# Patient Record
Sex: Female | Born: 1995 | Race: White | Hispanic: No | Marital: Single | Smoking: Current every day smoker
Health system: Southern US, Community
[De-identification: ages and names within clinical notes are randomized; demographics above are authoritative.]

## PROBLEM LIST (undated history)

## (undated) ENCOUNTER — Inpatient Hospital Stay: Payer: Self-pay

## (undated) DIAGNOSIS — D649 Anemia, unspecified: Secondary | ICD-10-CM

## (undated) DIAGNOSIS — K353 Acute appendicitis with localized peritonitis, without perforation or gangrene: Secondary | ICD-10-CM

## (undated) DIAGNOSIS — Z315 Encounter for genetic counseling: Secondary | ICD-10-CM

---
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
## 2017-05-20 ENCOUNTER — Emergency Department: Payer: Medicaid Other

## 2017-05-20 ENCOUNTER — Other Ambulatory Visit: Payer: Self-pay

## 2017-05-20 ENCOUNTER — Observation Stay
Admission: EM | Admit: 2017-05-20 | Discharge: 2017-05-22 | Disposition: A | Discharge status: Home / Self Care | Payer: Medicaid Other | Attending: Surgery | Admitting: Surgery

## 2017-05-20 DIAGNOSIS — F17210 Nicotine dependence, cigarettes, uncomplicated: Secondary | ICD-10-CM | POA: Diagnosis not present

## 2017-05-20 DIAGNOSIS — K3589 Other acute appendicitis without perforation or gangrene: Secondary | ICD-10-CM

## 2017-05-20 DIAGNOSIS — E876 Hypokalemia: Secondary | ICD-10-CM | POA: Diagnosis not present

## 2017-05-20 DIAGNOSIS — K353 Acute appendicitis with localized peritonitis, without perforation or gangrene: Secondary | ICD-10-CM | POA: Diagnosis present

## 2017-05-20 DIAGNOSIS — K3533 Acute appendicitis with perforation and localized peritonitis, with abscess: Principal | ICD-10-CM | POA: Insufficient documentation

## 2017-05-20 LAB — BASIC METABOLIC PANEL
Anion gap: 12 (ref 5–15)
BUN: 20 mg/dL (ref 6–20)
CHLORIDE: 102 mmol/L (ref 101–111)
CO2: 24 mmol/L (ref 22–32)
CREATININE: 0.84 mg/dL (ref 0.44–1.00)
Calcium: 9.6 mg/dL (ref 8.9–10.3)
GFR calc Af Amer: >60 mL/min (ref >60)
GFR calc non Af Amer: >60 mL/min (ref >60)
Glucose, Bld: 120 mg/dL — ABNORMAL HIGH (ref 65–99)
Potassium: 2.9 mmol/L — ABNORMAL LOW (ref 3.5–5.1)
Sodium: 138 mmol/L (ref 135–145)

## 2017-05-20 LAB — CBC
HEMATOCRIT: 42.2 % (ref 35.0–47.0)
Hemoglobin: 14.1 g/dL (ref 12.0–16.0)
MCH: 30.8 pg (ref 26.0–34.0)
MCHC: 33.5 g/dL (ref 32.0–36.0)
MCV: 91.9 fL (ref 80.0–100.0)
PLATELETS: 187 10*3/uL (ref 150–440)
RBC: 4.59 MIL/uL (ref 3.80–5.20)
RDW: 13.1 % (ref 11.5–14.5)
WBC: 13.7 10*3/uL — ABNORMAL HIGH (ref 3.6–11.0)

## 2017-05-20 LAB — TROPONIN I: Troponin I: <0.03 ng/mL (ref <0.03)

## 2017-05-20 LAB — POCT PREGNANCY, URINE: Preg Test, Ur: NEGATIVE

## 2017-05-20 NOTE — ED Provider Notes (Signed)
[facility] Emergency Department Provider Note   First MD Initiated Contact with Patient 05/20/17 2344     (approximate)  I have reviewed the triage vital signs and the nursing notes.   HISTORY  Chief Complaint Rib Injury    HPI Stephanie Navarro is a 21 y.o. female presents to the emergency department 1 day history of right-sided abdominal pain is currently 7 out of 10 accompanied by nausea and patient denies any fever.  Patient denies any urinary symptoms.  Patient states that the pain has been persistent since onset.  Patient denies any aggravating or alleviating factors.  Food intake approximately 3 PM   Past medical history None There are no active problems to display for this patient.  Past surgical history None  Prior to Admission medications   Not on File    Allergies No Known Drug Allergies No family history on file.  Social History Social History   Tobacco Use  . Smoking status: Current Every Day Smoker  . Smokeless tobacco: Never Used  Substance Use Topics  . Alcohol use: No    Frequency: Never  . Drug use: No    Review of Systems Constitutional: No fever/chills Eyes: No visual changes. ENT: No sore throat. Cardiovascular: Denies chest pain. Respiratory: Denies shortness of breath. Gastrointestinal:Positive for abdominal pain, nausea &vomiting.  No diarrhea.  No constipation. Genitourinary: Negative for dysuria. Musculoskeletal: Negative for neck pain.  Negative for back pain. Integumentary: Negative for rash. Neurological: Negative for headaches, focal weakness or numbness.  ____________________________________________   PHYSICAL EXAM:  VITAL SIGNS: ED Triage Vitals  Enc Vitals Group     BP 05/20/17 2105 (!) 113/54     Pulse Rate 05/20/17 2105 65     Resp 05/20/17 2105 18     Temp 05/20/17 2105 98.2 F (36.8 C)     Temp Source 05/20/17 2105 Oral     SpO2 05/20/17 2105 98 %     Weight 05/20/17 2102 61.2 kg  (135 lb)     Height 05/20/17 2102 1.6 m (5\' 3" )     Head Circumference --      Peak Flow --      Pain Score 05/20/17 2102 10     Pain Loc --      Pain Edu? --      Excl. in GC? --     Constitutional: Alert and oriented. Well appearing and in no acute distress. Eyes: Conjunctivae are normal.  Head: Atraumatic. Mouth/Throat: Mucous membranes are moist. Oropharynx non-erythematous. Neck: No stridor.  Cardiovascular: Normal rate, regular rhythm. Good peripheral circulation. Grossly normal heart sounds. Respiratory: Normal respiratory effort.  No retractions. Lungs CTAB. Gastrointestinal: Right lower quadrant tenderness to palpation.  No distention.  Musculoskeletal: No lower extremity tenderness nor edema. No gross deformities of extremities. Neurologic:  Normal speech and language. No gross focal neurologic deficits are appreciated.  Skin:  Skin is warm, dry and intact. No rash noted. Psychiatric: Mood and affect are normal. Speech and behavior are normal.  ____________________________________________   LABS (all labs ordered are listed, but only abnormal results are displayed)  Labs Reviewed  BASIC METABOLIC PANEL - Abnormal; Notable for the following components:      Result Value   Potassium 2.9 (*)    Glucose, Bld 120 (*)    All other components within normal limits  CBC - Abnormal; Notable for the following components:   WBC 13.7 (*)    All other components within normal limits  TROPONIN I  POC URINE PREG, ED  POCT PREGNANCY, URINE    RADIOLOGY I, Woodward N BROWN, personally viewed and evaluated these images (plain radiographs) as part of my medical decision making, as well as reviewing the written report by the radiologist.  Dg Chest 2 View  Result Date: 05/20/2017 CLINICAL DATA:  Acute onset of right-sided rib pain.  Nausea. EXAM: CHEST  2 VIEW COMPARISON:  None. FINDINGS: The lungs are well-aerated and clear. There is no evidence of focal opacification, pleural  effusion or pneumothorax. The heart is normal in size; the mediastinal contour is within normal limits. No acute osseous abnormalities are seen. IMPRESSION: No acute cardiopulmonary process seen. No displaced rib fractures identified. Electronically Signed   By: Roanna RaiderJeffery  Chang M.D.   On: 05/20/2017 21:36     Procedures   ____________________________________________   INITIAL IMPRESSION / ASSESSMENT AND PLAN / ED COURSE  As part of my medical decision making, I reviewed the following data within the electronic MEDICAL RECORD [age] year old female present with above-stated history and physical exam concerning for possible acute appendicitis and as such CT scan of the abdomen was performed.  CT findings consistent with acute appendicitis and as such patient discussed with Dr. Earlene Plateravis general surgeon on-call ____________________________________________  FINAL CLINICAL IMPRESSION(S) / ED DIAGNOSES  Final diagnoses:  Other acute appendicitis     MEDICATIONS GIVEN DURING THIS VISIT:  Medications - No data to display   ED Discharge Orders    None       Note:  This document was prepared using Dragon voice recognition software and may include unintentional dictation errors.    Darci CurrentBrown, [place] N, MD 05/21/17 [phone]

## 2017-05-20 NOTE — ED Triage Notes (Signed)
Pt presents to ED via POV from home with c/o RIGHT-sided rib pain x2 days. Pt denies any recent injury or trauma. Pt reports 10/10 pain. Pt reports (+) nausea, but denies vomiting or diarrhea. Pt is A&O, in NAD; RR even, regular, and unlabored; skin color/temp is WNL.

## 2017-05-20 NOTE — ED Notes (Addendum)
Pt reports she has right anterior rib pain.  Sx began yesterday.  Pt states no known injury.  No back pain.  No diff breathing   cig smoker.   Pt also has nausea.  Pt alert.

## 2017-05-21 ENCOUNTER — Other Ambulatory Visit: Payer: Self-pay

## 2017-05-21 ENCOUNTER — Encounter: Payer: Self-pay | Admitting: Radiology

## 2017-05-21 ENCOUNTER — Emergency Department: Payer: Medicaid Other

## 2017-05-21 ENCOUNTER — Encounter
Admission: EM | Disposition: A | Discharge status: Home / Self Care | Payer: Self-pay | Source: Home / Self Care | Attending: Emergency Medicine

## 2017-05-21 ENCOUNTER — Observation Stay: Payer: Medicaid Other | Admitting: Certified Registered Nurse Anesthetist

## 2017-05-21 DIAGNOSIS — K353 Acute appendicitis with localized peritonitis, without perforation or gangrene: Secondary | ICD-10-CM | POA: Diagnosis not present

## 2017-05-21 HISTORY — PX: LAPAROSCOPIC APPENDECTOMY: SHX408

## 2017-05-21 HISTORY — PX: APPENDECTOMY: SHX54

## 2017-05-21 HISTORY — DX: Acute appendicitis with localized peritonitis, without perforation or gangrene: K35.30

## 2017-05-21 LAB — CBC WITH DIFFERENTIAL/PLATELET
BASOS PCT: 0 %
Basophils Absolute: 0 10*3/uL (ref 0–0.1)
EOS ABS: 0 10*3/uL (ref 0–0.7)
EOS PCT: 0 %
HCT: 37.1 % (ref 35.0–47.0)
HEMOGLOBIN: 12.7 g/dL (ref 12.0–16.0)
Lymphocytes Relative: 14 %
Lymphs Abs: 1.5 10*3/uL (ref 1.0–3.6)
MCH: 31.7 pg (ref 26.0–34.0)
MCHC: 34.1 g/dL (ref 32.0–36.0)
MCV: 92.9 fL (ref 80.0–100.0)
MONO ABS: 0.8 10*3/uL (ref 0.2–0.9)
MONOS PCT: 8 %
NEUTROS PCT: 78 %
Neutro Abs: 7.9 10*3/uL — ABNORMAL HIGH (ref 1.4–6.5)
PLATELETS: 139 10*3/uL — AB (ref 150–440)
RBC: 3.99 MIL/uL (ref 3.80–5.20)
RDW: 13.2 % (ref 11.5–14.5)
WBC: 10.2 10*3/uL (ref 3.6–11.0)

## 2017-05-21 LAB — BASIC METABOLIC PANEL
Anion gap: 6 (ref 5–15)
BUN: 14 mg/dL (ref 6–20)
CALCIUM: 8.8 mg/dL — AB (ref 8.9–10.3)
CO2: 22 mmol/L (ref 22–32)
CREATININE: 0.73 mg/dL (ref 0.44–1.00)
Chloride: 110 mmol/L (ref 101–111)
Glucose, Bld: 112 mg/dL — ABNORMAL HIGH (ref 65–99)
Potassium: 3.6 mmol/L (ref 3.5–5.1)
SODIUM: 138 mmol/L (ref 135–145)

## 2017-05-21 SURGERY — APPENDECTOMY, LAPAROSCOPIC
Anesthesia: General

## 2017-05-21 SURGERY — APPENDECTOMY, LAPAROSCOPIC
Anesthesia: General | Wound class: Clean Contaminated

## 2017-05-21 MED ORDER — FENTANYL CITRATE (PF) 100 MCG/2ML IJ SOLN
INTRAMUSCULAR | Status: DC | PRN
  Administered 2017-05-21: 50 ug via INTRAVENOUS
  Administered 2017-05-21: 100 ug via INTRAVENOUS
  Administered 2017-05-21: 50 ug via INTRAVENOUS

## 2017-05-21 MED ORDER — MORPHINE SULFATE (PF) 2 MG/ML IV SOLN
2.0000 mg | INTRAVENOUS | Status: DC | PRN
  Filled 2017-05-21 (×2): qty 1

## 2017-05-21 MED ORDER — LIDOCAINE HCL (CARDIAC) 20 MG/ML IV SOLN
INTRAVENOUS | Status: DC | PRN
  Administered 2017-05-21: 40 mg via INTRAVENOUS

## 2017-05-21 MED ORDER — ETONOGESTREL 68 MG ~~LOC~~ IMPL
1.0000 | DRUG_IMPLANT | Freq: Once | SUBCUTANEOUS | Status: DC
  Filled 2017-05-21: qty 1

## 2017-05-21 MED ORDER — DEXTROSE IN LACTATED RINGERS 5 % IV SOLN
INTRAVENOUS | Status: DC
  Administered 2017-05-22: 04:00:00 via INTRAVENOUS

## 2017-05-21 MED ORDER — SUGAMMADEX SODIUM 200 MG/2ML IV SOLN
INTRAVENOUS | Status: AC
  Filled 2017-05-21: qty 4

## 2017-05-21 MED ORDER — FENTANYL CITRATE (PF) 100 MCG/2ML IJ SOLN: 25.0000 ug | INTRAMUSCULAR | Status: DC | PRN

## 2017-05-21 MED ORDER — MEPERIDINE HCL 50 MG/ML IJ SOLN: 6.2500 mg | INTRAMUSCULAR | Status: DC | PRN

## 2017-05-21 MED ORDER — OXYCODONE HCL 5 MG PO TABS
5.0000 mg | ORAL_TABLET | Freq: Once | ORAL | Status: DC | PRN
Start: 2017-05-21 — End: 2017-05-21

## 2017-05-21 MED ORDER — ONDANSETRON HCL 4 MG/2ML IJ SOLN
4.0000 mg | Freq: Four times a day (QID) | INTRAMUSCULAR | Status: DC | PRN
  Administered 2017-05-21 – 2017-05-22 (×2): 4 mg via INTRAVENOUS
  Filled 2017-05-21 (×2): qty 2

## 2017-05-21 MED ORDER — KETOROLAC TROMETHAMINE 30 MG/ML IJ SOLN
INTRAMUSCULAR | Status: DC | PRN
  Administered 2017-05-21: 30 mg via INTRAVENOUS

## 2017-05-21 MED ORDER — MIDAZOLAM HCL 2 MG/2ML IJ SOLN
INTRAMUSCULAR | Status: DC | PRN
  Administered 2017-05-21: 2 mg via INTRAVENOUS

## 2017-05-21 MED ORDER — BUPIVACAINE-EPINEPHRINE (PF) 0.25% -1:200000 IJ SOLN
INTRAMUSCULAR | Status: AC
  Filled 2017-05-21: qty 30

## 2017-05-21 MED ORDER — ACETAMINOPHEN 10 MG/ML IV SOLN
INTRAVENOUS | Status: DC | PRN
  Administered 2017-05-21: 1000 mg via INTRAVENOUS

## 2017-05-21 MED ORDER — BUPIVACAINE-EPINEPHRINE (PF) 0.25% -1:200000 IJ SOLN
INTRAMUSCULAR | Status: DC | PRN
  Administered 2017-05-21: 30 mL via PERINEURAL

## 2017-05-21 MED ORDER — ONDANSETRON 4 MG PO TBDP: 4.0000 mg | ORAL_TABLET | Freq: Four times a day (QID) | ORAL | Status: DC | PRN

## 2017-05-21 MED ORDER — LACTATED RINGERS IV SOLN
INTRAVENOUS | Status: DC
  Administered 2017-05-21: 14:00:00 via INTRAVENOUS

## 2017-05-21 MED ORDER — ACETAMINOPHEN 10 MG/ML IV SOLN
INTRAVENOUS | Status: AC
  Filled 2017-05-21: qty 100

## 2017-05-21 MED ORDER — METRONIDAZOLE IN NACL 5-0.79 MG/ML-% IV SOLN
500.0000 mg | Freq: Three times a day (TID) | INTRAVENOUS | Status: DC
  Administered 2017-05-21 (×2): 500 mg via INTRAVENOUS
  Filled 2017-05-21 (×5): qty 100

## 2017-05-21 MED ORDER — FAMOTIDINE 20 MG PO TABS
ORAL_TABLET | ORAL | Status: AC
  Filled 2017-05-21: qty 1

## 2017-05-21 MED ORDER — POTASSIUM CHLORIDE 10 MEQ/100ML IV SOLN
10.0000 meq | INTRAVENOUS | Status: DC
  Administered 2017-05-21 (×2): 10 meq via INTRAVENOUS
  Filled 2017-05-21 (×3): qty 100

## 2017-05-21 MED ORDER — ONDANSETRON HCL 4 MG/2ML IJ SOLN
INTRAMUSCULAR | Status: AC
  Filled 2017-05-21: qty 2

## 2017-05-21 MED ORDER — KETOROLAC TROMETHAMINE 30 MG/ML IJ SOLN
INTRAMUSCULAR | Status: AC
  Filled 2017-05-21: qty 1

## 2017-05-21 MED ORDER — ROCURONIUM BROMIDE 50 MG/5ML IV SOLN
INTRAVENOUS | Status: AC
  Filled 2017-05-21: qty 1

## 2017-05-21 MED ORDER — FAMOTIDINE 20 MG PO TABS
20.0000 mg | ORAL_TABLET | Freq: Once | ORAL | Status: AC
  Administered 2017-05-21: 20 mg via ORAL

## 2017-05-21 MED ORDER — KCL-LACTATED RINGERS-D5W 20 MEQ/L IV SOLN
INTRAVENOUS | Status: DC
  Administered 2017-05-21 (×2): via INTRAVENOUS
  Filled 2017-05-21 (×8): qty 1000

## 2017-05-21 MED ORDER — PROPOFOL 10 MG/ML IV BOLUS
INTRAVENOUS | Status: DC | PRN
  Administered 2017-05-21: 100 mg via INTRAVENOUS

## 2017-05-21 MED ORDER — MORPHINE SULFATE (PF) 2 MG/ML IV SOLN
2.0000 mg | INTRAVENOUS | Status: DC | PRN
  Administered 2017-05-21 (×2): 2 mg via INTRAVENOUS
  Filled 2017-05-21: qty 1

## 2017-05-21 MED ORDER — CHLORHEXIDINE GLUCONATE CLOTH 2 % EX PADS: 6.0000 | MEDICATED_PAD | Freq: Once | CUTANEOUS | Status: DC

## 2017-05-21 MED ORDER — IOPAMIDOL (ISOVUE-300) INJECTION 61%
100.0000 mL | Freq: Once | INTRAVENOUS | Status: AC | PRN
  Administered 2017-05-21: 100 mL via INTRAVENOUS

## 2017-05-21 MED ORDER — ROCURONIUM BROMIDE 100 MG/10ML IV SOLN
INTRAVENOUS | Status: DC | PRN
  Administered 2017-05-21: 40 mg via INTRAVENOUS

## 2017-05-21 MED ORDER — SUGAMMADEX SODIUM 200 MG/2ML IV SOLN
INTRAVENOUS | Status: DC | PRN
  Administered 2017-05-21: 250 mg via INTRAVENOUS

## 2017-05-21 MED ORDER — PROPOFOL 10 MG/ML IV BOLUS
INTRAVENOUS | Status: AC
  Filled 2017-05-21: qty 20

## 2017-05-21 MED ORDER — DEXTROSE 5 % IV SOLN
2.0000 g | INTRAVENOUS | Status: DC
  Administered 2017-05-21: 2 g via INTRAVENOUS
  Filled 2017-05-21 (×2): qty 2

## 2017-05-21 MED ORDER — PROMETHAZINE HCL 25 MG/ML IJ SOLN: 6.2500 mg | INTRAMUSCULAR | Status: DC | PRN

## 2017-05-21 MED ORDER — MIDAZOLAM HCL 2 MG/2ML IJ SOLN
INTRAMUSCULAR | Status: AC
  Filled 2017-05-21: qty 2

## 2017-05-21 MED ORDER — HYDROCODONE-ACETAMINOPHEN 5-325 MG PO TABS
1.0000 | ORAL_TABLET | ORAL | Status: DC | PRN
  Administered 2017-05-21 – 2017-05-22 (×3): 1 via ORAL
  Filled 2017-05-21 (×3): qty 1

## 2017-05-21 MED ORDER — FENTANYL CITRATE (PF) 250 MCG/5ML IJ SOLN
INTRAMUSCULAR | Status: AC
  Filled 2017-05-21: qty 5

## 2017-05-21 MED ORDER — DEXAMETHASONE SODIUM PHOSPHATE 10 MG/ML IJ SOLN
INTRAMUSCULAR | Status: AC
  Filled 2017-05-21: qty 1

## 2017-05-21 MED ORDER — ONDANSETRON HCL 4 MG/2ML IJ SOLN
INTRAMUSCULAR | Status: DC | PRN
  Administered 2017-05-21: 4 mg via INTRAVENOUS

## 2017-05-21 MED ORDER — CHLORHEXIDINE GLUCONATE CLOTH 2 % EX PADS
6.0000 | MEDICATED_PAD | Freq: Once | CUTANEOUS | Status: AC
  Administered 2017-05-21: 6 via TOPICAL

## 2017-05-21 MED ORDER — DEXAMETHASONE SODIUM PHOSPHATE 10 MG/ML IJ SOLN
INTRAMUSCULAR | Status: DC | PRN
  Administered 2017-05-21: 10 mg via INTRAVENOUS

## 2017-05-21 MED ORDER — LIDOCAINE HCL (PF) 2 % IJ SOLN
INTRAMUSCULAR | Status: AC
  Filled 2017-05-21: qty 10

## 2017-05-21 MED ORDER — OXYCODONE HCL 5 MG/5ML PO SOLN: 5.0000 mg | Freq: Once | ORAL | Status: DC | PRN

## 2017-05-21 SURGICAL SUPPLY — 43 items
ADHESIVE MASTISOL STRL (MISCELLANEOUS) ×3 IMPLANT
APPLIER CLIP ROT 10 11.4 M/L (STAPLE) ×3
BLADE SURG SZ11 CARB STEEL (BLADE) ×3 IMPLANT
CANISTER SUCT 3000ML PPV (MISCELLANEOUS) ×3 IMPLANT
CHLORAPREP W/TINT 26ML (MISCELLANEOUS) ×3 IMPLANT
CLIP APPLIE ROT 10 11.4 M/L (STAPLE) ×1 IMPLANT
CLOSURE WOUND 1/2 X4 (GAUZE/BANDAGES/DRESSINGS) ×1
CUTTER FLEX LINEAR 45M (STAPLE) ×3 IMPLANT
DEVICE TROCAR PUNCTURE CLOSURE (ENDOMECHANICALS) ×3 IMPLANT
ELECT REM PT RETURN 9FT ADLT (ELECTROSURGICAL) ×3
ELECTRODE REM PT RTRN 9FT ADLT (ELECTROSURGICAL) ×1 IMPLANT
GAUZE SPONGE NON-WVN 2X2 STRL (MISCELLANEOUS) ×3 IMPLANT
GLOVE BIO SURGEON STRL SZ8 (GLOVE) ×3 IMPLANT
GOWN STRL REUS W/ TWL LRG LVL3 (GOWN DISPOSABLE) ×2 IMPLANT
GOWN STRL REUS W/TWL LRG LVL3 (GOWN DISPOSABLE) ×4
IRRIGATION STRYKERFLOW (MISCELLANEOUS) ×1 IMPLANT
IRRIGATOR STRYKERFLOW (MISCELLANEOUS) ×3
KIT RM TURNOVER STRD PROC AR (KITS) ×3 IMPLANT
LABEL OR SOLS (LABEL) ×3 IMPLANT
NEEDLE HYPO 22GX1.5 SAFETY (NEEDLE) ×3 IMPLANT
NEEDLE VERESS 14GA 120MM (NEEDLE) ×3 IMPLANT
NS IRRIG 500ML POUR BTL (IV SOLUTION) ×3 IMPLANT
PACK LAP CHOLECYSTECTOMY (MISCELLANEOUS) ×3 IMPLANT
POUCH SPECIMEN RETRIEVAL 10MM (ENDOMECHANICALS) ×3 IMPLANT
RELOAD 45 VASCULAR/THIN (ENDOMECHANICALS) ×6 IMPLANT
RELOAD BLUE CHELON 45 (STAPLE) IMPLANT
RELOAD STAPLE TA45 3.5 REG BLU (ENDOMECHANICALS) ×3 IMPLANT
RELOAD WH ECHELON 45 (STAPLE) IMPLANT
SCISSORS METZENBAUM CVD 33 (INSTRUMENTS) IMPLANT
SLEEVE ENDOPATH XCEL 5M (ENDOMECHANICALS) ×3 IMPLANT
SOL .9 NS 3000ML IRR  AL (IV SOLUTION) ×2
SOL .9 NS 3000ML IRR UROMATIC (IV SOLUTION) ×1 IMPLANT
SPONGE LAP 18X18 5 PK (GAUZE/BANDAGES/DRESSINGS) ×6 IMPLANT
SPONGE VERSALON 2X2 STRL (MISCELLANEOUS) ×6
STRIP CLOSURE SKIN 1/2X4 (GAUZE/BANDAGES/DRESSINGS) ×2 IMPLANT
SUT MNCRL 4-0 (SUTURE) ×2
SUT MNCRL 4-0 27XMFL (SUTURE) ×1
SUT VICRYL 0 TIES 12 18 (SUTURE) ×3 IMPLANT
SUTURE MNCRL 4-0 27XMF (SUTURE) ×1 IMPLANT
TRAY FOLEY W/METER SILVER 16FR (SET/KITS/TRAYS/PACK) ×3 IMPLANT
TROCAR XCEL 12X100 BLDLESS (ENDOMECHANICALS) ×3 IMPLANT
TROCAR XCEL NON-BLD 5MMX100MML (ENDOMECHANICALS) ×3 IMPLANT
TUBING INSUFFLATION (TUBING) ×3 IMPLANT

## 2017-05-21 SURGICAL SUPPLY — 38 items
BLADE SURG SZ11 CARB STEEL (BLADE) ×3 IMPLANT
CANISTER SUCT 3000ML PPV (MISCELLANEOUS) ×3 IMPLANT
CHLORAPREP W/TINT 26ML (MISCELLANEOUS) ×3 IMPLANT
CUTTER FLEX LINEAR 45M (STAPLE) ×3 IMPLANT
DECANTER SPIKE VIAL GLASS SM (MISCELLANEOUS) ×3 IMPLANT
DERMABOND ADVANCED (GAUZE/BANDAGES/DRESSINGS) ×2
DERMABOND ADVANCED .7 DNX12 (GAUZE/BANDAGES/DRESSINGS) ×1 IMPLANT
ELECT REM PT RETURN 9FT ADLT (ELECTROSURGICAL) ×3
ELECTRODE REM PT RTRN 9FT ADLT (ELECTROSURGICAL) ×1 IMPLANT
GLOVE BIO SURGEON STRL SZ7 (GLOVE) ×3 IMPLANT
GLOVE BIOGEL PI IND STRL 7.5 (GLOVE) ×1 IMPLANT
GLOVE BIOGEL PI INDICATOR 7.5 (GLOVE) ×2
GOWN STRL REUS W/ TWL LRG LVL4 (GOWN DISPOSABLE) ×1 IMPLANT
GOWN STRL REUS W/ TWL XL LVL3 (GOWN DISPOSABLE) ×1 IMPLANT
GOWN STRL REUS W/TWL LRG LVL4 (GOWN DISPOSABLE) ×2
GOWN STRL REUS W/TWL XL LVL3 (GOWN DISPOSABLE) ×2
GRASPER SUT TROCAR 14GX15 (MISCELLANEOUS) ×3 IMPLANT
IRRIGATION STRYKERFLOW (MISCELLANEOUS) IMPLANT
IRRIGATOR STRYKERFLOW (MISCELLANEOUS)
KIT RM TURNOVER STRD PROC AR (KITS) ×3 IMPLANT
NEEDLE HYPO 22GX1.5 SAFETY (NEEDLE) ×3 IMPLANT
NEEDLE INSUFFLATION 14GA 120MM (NEEDLE) ×3 IMPLANT
NS IRRIG 1000ML POUR BTL (IV SOLUTION) ×3 IMPLANT
PACK LAP CHOLECYSTECTOMY (MISCELLANEOUS) ×3 IMPLANT
POUCH SPECIMEN RETRIEVAL 10MM (ENDOMECHANICALS) ×3 IMPLANT
RELOAD 45 VASCULAR/THIN (ENDOMECHANICALS) IMPLANT
RELOAD STAPLE TA45 3.5 REG BLU (ENDOMECHANICALS) ×3 IMPLANT
SHEARS HARMONIC ACE PLUS 36CM (ENDOMECHANICALS) ×3 IMPLANT
SLEEVE ENDOPATH XCEL 5M (ENDOMECHANICALS) ×3 IMPLANT
SOL .9 NS 3000ML IRR  AL (IV SOLUTION)
SOL .9 NS 3000ML IRR UROMATIC (IV SOLUTION) IMPLANT
SUT MNCRL AB 4-0 PS2 18 (SUTURE) ×3 IMPLANT
SUT VICRYL 0 UR6 27IN ABS (SUTURE) ×3 IMPLANT
SUT VICRYL AB 3-0 FS1 BRD 27IN (SUTURE) ×3 IMPLANT
TRAY FOLEY W/METER SILVER 16FR (SET/KITS/TRAYS/PACK) ×3 IMPLANT
TROCAR XCEL 12X100 BLDLESS (ENDOMECHANICALS) ×3 IMPLANT
TROCAR XCEL NON-BLD 5MMX100MML (ENDOMECHANICALS) ×3 IMPLANT
TUBING INSUFFLATION (TUBING) ×3 IMPLANT

## 2017-05-21 NOTE — Consult Note (Signed)
SURGICAL CONSULTATION NOTE (initial) - cpt: 16109: [id number]  HISTORY OF PRESENT ILLNESS (HPI):  21 y.o. female [facility] for evaluation of abdominal pain. Patient reports acute onset of upper abdominal pain yesterday afternoon, since which it has progressed to become focused at her RLQ. Patient also reports nausea with non-bloody emesis, denies diarrhea or constipation, fever/chills, CP, or SOB. When told her diagnosis, patient first asked about results of her pregnancy test, seeming relieved but surprised that it was negative, and expressed eagerness to be admitted to hospital so she can sleep, saying she "can't sleep at home".  Surgery is consulted by ED physician Dr. Manson PasseyBrown in this context for evaluation and management of acute appendicitis.  PAST MEDICAL HISTORY (PMH):  History reviewed. No pertinent past medical history.   PAST SURGICAL HISTORY (PSH):  History reviewed. No pertinent surgical history.  Vaginal childbirth at age 21 years old  MEDICATIONS:  Prior to Admission medications   Not on File     ALLERGIES:  No Known Allergies   SOCIAL HISTORY:  Social History   Socioeconomic History  . Marital status: Single    Spouse name: Not on file  . Number of children: Not on file  . Years of education: Not on file  . Highest education level: Not on file  Social Needs  . Financial resource strain: Not on file  . Food insecurity - worry: Not on file  . Food insecurity - inability: Not on file  . Transportation needs - medical: Not on file  . Transportation needs - non-medical: Not on file  Occupational History  . Not on file  Tobacco Use  . Smoking status: Current Every Day Smoker  . Smokeless tobacco: Never Used  Substance and Sexual Activity  . Alcohol use: No    Frequency: Never  . Drug use: No  . Sexual activity: Yes  Other Topics Concern  . Not on file  Social History Narrative  . Not on file    The patient currently resides (home / rehab facility / nursing  home): Home The patient normally is (ambulatory / bedbound): Ambulatory   FAMILY HISTORY:  No family history on file.   REVIEW OF SYSTEMS:  Constitutional: denies weight loss, fever, chills, or sweats  Eyes: denies any other vision changes, history of eye injury  ENT: denies sore throat, hearing problems  Respiratory: denies shortness of breath, wheezing  Cardiovascular: denies chest pain, palpitations  Gastrointestinal: abdominal pain, N/V, and bowel function as per HPI Genitourinary: denies burning with urination or urinary frequency Musculoskeletal: denies any other joint pains or cramps  Skin: denies any other rashes or skin discolorations  Neurological: denies any other headache, dizziness, weakness  Psychiatric: denies any other depression, anxiety   All other review of systems were negative   VITAL SIGNS:  Temp:  [98.2 F (36.8 C)] 98.2 F (36.8 C) (12/11 2105) Pulse Rate:  [65] 65 (12/11 2105) Resp:  [18] 18 (12/11 2105) BP: (113)/(54) 113/54 (12/11 2105) SpO2:  [98 %] 98 % (12/11 2105) Weight:  [135 lb (61.2 kg)] 135 lb (61.2 kg) (12/11 2102)     Height: 5\' 3"  (160 cm) Weight: 135 lb (61.2 kg) BMI (Calculated): 23.92   INTAKE/OUTPUT:  This shift: No intake/output data recorded.  Last 2 shifts: @IOLAST2SHIFTS @   PHYSICAL EXAM:  Constitutional:  -- Normal body habitus  -- Awake, alert, and oriented x3, no apparent distress -- Under covers in bed with boyfriend in ED Eyes:  -- Pupils equally round  and reactive to light  -- No scleral icterus, B/L no occular discharge Ear, nose, throat: -- Neck is FROM WNL -- No jugular venous distension  Pulmonary:  -- No wheezes or rhales -- Equal breath sounds bilaterally -- Breathing non-labored at rest Cardiovascular:  -- S1, S2 present  -- No pericardial rubs  Gastrointestinal:  -- Abdomen soft and non-distended with mild-/moderate- RLQ abdominal tenderness to palpation, no guarding or rebound tenderness -- No  abdominal masses appreciated, pulsatile or otherwise  Musculoskeletal and Integumentary:  -- Wounds or skin discoloration: None appreciated -- Extremities: B/L UE and LE FROM, hands and feet warm, no edema  Neurologic:  -- Motor function: Intact and symmetric -- Sensation: Intact and symmetric Psychiatric:  -- Mood and affect WNL  Labs:  CBC Latest Ref Rng & Units 05/20/2017 06/07/2012 06/05/2012  WBC 3.6 - 11.0 K/uL 13.7(H) - 18.2(H)  Hemoglobin 12.0 - 16.0 g/dL 16.114.1 - 09.612.2  Hematocrit 35.0 - 47.0 % 42.2 34.2(L) 37.1  Platelets 150 - 440 K/uL 187 - 184   CMP Latest Ref Rng & Units 05/20/2017  Glucose 65 - 99 mg/dL 045(W120(H)  BUN 6 - 20 mg/dL 20  Creatinine 0.980.44 - 1.191.00 mg/dL 1.470.84  Sodium 829135 - 562145 mmol/L 138  Potassium 3.5 - 5.1 mmol/L 2.9(L)  Chloride 101 - 111 mmol/L 102  CO2 22 - 32 mmol/L 24  Calcium 8.9 - 10.3 mg/dL 9.6   Imaging studies:  CT Abdomen and Pelvis with Contrast (05/21/2017) - personally reviewed with patient and her boyfriend bedside Dilatation of the appendix to 1.1 cm in diameter on coronal images, raising concern for acute appendicitis. The appendix is retrocecal in nature, seen at the right hemipelvis. No evidence of perforation or abscess formation. Mild associated wall thickening at the cecum. Surrounding soft tissue inflammation noted.  Assessment/Plan: (ICD-10's: K35.3) 21 y.o. female with acute non-perforated appendicitis, complicated by hypokalemia, low-grade leukocytosis, and chronic ongoing tobacco abuse following childbirth at age 21 years old.   - NPO, IV fluids   - correct electrolytes (potassium)   - IV antibiotics (ceftriaxone + metronidazole)  all risks, benefits, and alternatives to appendectomy were discussed with the patient and her boyfriend (bedside), all of their questions were answered to their expressed satisfaction, patient expresses she wishes to proceed, and informed consent was obtained.   - will plan for laparoscopic  appendectomy tomorrow morning following repeat potassium level  - DVT prophylaxis  All of the above findings and recommendations were discussed with the patient and her boyfriend (bedside), and all of patient's and her family's questions were answered to their expressed satisfaction.  Thank you for the opportunity to participate in this patient's care.   -- Scherrie GerlachJason E. Earlene Plateravis, MD, RPVI [place]: [facility] General Surgery - Partnering for exceptional care. Office: [phone]

## 2017-05-21 NOTE — Op Note (Signed)
laparascopic appendectomy   Stephanie Navarro Date of operation:  05/21/2017  Indications: The patient presented with a history of  abdominal pain. Workup has revealed findings consistent with acute appendicitis.  Pre-operative Diagnosis:acute appendicitis  Post-operative Diagnosis: acute appendicitis  Surgeon: Adah Salvageichard E. Excell Seltzerooper, MD, FACS  Anesthesia: General with endotracheal tube  Procedure Details  The patient was seen again in the preop area. The options of surgery versus observation were reviewed with the patient and/or family. The risks of bleeding, infection, recurrence of symptoms, negative laparoscopy, potential for an open procedure, bowel injury, abscess or infection, were all reviewed as well. The patient was taken to Operating Room, identified as Stephanie Navarro and the procedure verified as laparoscopic appendectomy. A Time Out was held and the above information confirmed.  The patient was placed in the supine position and general anesthesia was induced.  Antibiotic prophylaxis was administered and VT E prophylaxis was in place. A Foley catheter was placed by the nursing staff.   The abdomen was prepped and draped in a sterile fashion. An infraumbilical incision was made. A Veress needle was placed and pneumoperitoneum was obtained. A 5 mm trocar port was placed without difficulty and the abdominal cavity was explored.  Under direct vision a 5 mm suprapubic port was placed and a 13 mm left lateral port was placed all under direct vision.  The appendix was identified and found to be acutely inflamed but not retrocecal The appendix was carefully dissected. The base of the appendix was dissected out and divided with a standard load Endo GIA. The mesoappendix was divided with a vascular load Endo GIA.  The appendix was passed out through the left lateral port site with the aid of an Endo Catch bag. The right lower quadrant and pelvis was then irrigated with copious amounts of  normal saline which was aspirated. Inspection  failed to identify any additional bleeding and there were no signs of bowel injury. Therefore the left lateral port site was closed under direct vision utilizing an Endo Close technique with 0 Vicryl interrupted sutures, all under direct vision.   Again the right lower quadrant was inspected there was no sign of bleeding or bowel injury therefore pneumoperitoneum was released, all ports were removed and the skin incisions were approximated with subcuticular 4-0 Monocryl. Steri-Strips and Mastisol and sterile dressings were placed.  The patient tolerated the procedure well, there were no complications. The sponge lap and needle count were correct at the end of the procedure.  The patient was taken to the recovery room in stable condition to be admitted for continued care.  Findings: acute appendicitis, nonruptured  Estimated Blood Loss: nil                  Specimens: appendix         Complications:  none                  Camella Seim E. Excell Seltzerooper MD, FACS

## 2017-05-21 NOTE — ED Notes (Signed)
ED Provider at bedside. 

## 2017-05-21 NOTE — Progress Notes (Signed)
CC: Acute appendicitis Subjective: Patient was met in her room.  I discussed her care with Dr. Rosana Hoes.  Her potassium level was low and has been corrected labs are currently pending for repeat.  I have personally reviewed her CT scan.  Objective: Vital signs in last 24 hours: Temp:  [98.2 F (36.8 C)-98.5 F (36.9 C)] 98.5 F (36.9 C) (12/12 0303) Pulse Rate:  [65-69] 69 (12/12 0303) Resp:  [18-20] 20 (12/12 0303) BP: (102-113)/(42-54) 102/42 (12/12 0303) SpO2:  [98 %-100 %] 100 % (12/12 0303) Weight:  [135 lb (61.2 kg)] 135 lb (61.2 kg) (12/11 2102) Last BM Date: 05/20/17  Intake/Output from previous day: 12/11 0701 - 12/12 0700 In: 851 [I.V.:501; IV Piggyback:350] Out: 0  Intake/Output this shift: No intake/output data recorded.  Physical exam:  Soft tender in the right lower quadrant.  Awake alert and oriented  Lab Results: CBC  Recent Labs    05/20/17 2105 05/21/17 0715  WBC 13.7* 10.2  HGB 14.1 12.7  HCT 42.2 37.1  PLT 187 139*   BMET Recent Labs    05/20/17 2105  NA 138  K 2.9*  CL 102  CO2 24  GLUCOSE 120*  BUN 20  CREATININE 0.84  CALCIUM 9.6   PT/INR No results for input(s): LABPROT, INR in the last 72 hours. ABG No results for input(s): PHART, HCO3 in the last 72 hours.  Invalid input(s): PCO2, PO2  Studies/Results: Dg Chest 2 View  Result Date: 05/20/2017 CLINICAL DATA:  Acute onset of right-sided rib pain.  Nausea. EXAM: CHEST  2 VIEW COMPARISON:  None. FINDINGS: The lungs are well-aerated and clear. There is no evidence of focal opacification, pleural effusion or pneumothorax. The heart is normal in size; the mediastinal contour is within normal limits. No acute osseous abnormalities are seen. IMPRESSION: No acute cardiopulmonary process seen. No displaced rib fractures identified. Electronically Signed   By: Garald Balding M.D.   On: 05/20/2017 21:36   Ct Abdomen Pelvis W Contrast  Result Date: 05/21/2017 CLINICAL DATA:  Acute onset of  right-sided rib pain.  Nausea. EXAM: CT ABDOMEN AND PELVIS WITH CONTRAST TECHNIQUE: Multidetector CT imaging of the abdomen and pelvis was performed using the standard protocol following bolus administration of intravenous contrast. CONTRAST:  1110m ISOVUE-300 IOPAMIDOL (ISOVUE-300) INJECTION 61% COMPARISON:  Pelvic ultrasound performed 02/27/2012 FINDINGS: Lower chest: The visualized lung bases are grossly clear. The visualized portions of the mediastinum are unremarkable. Hepatobiliary: The liver is unremarkable in appearance. The gallbladder is unremarkable in appearance. The common bile duct remains normal in caliber. Pancreas: The pancreas is within normal limits. Spleen: The spleen is unremarkable in appearance. Adrenals/Urinary Tract: The adrenal glands are unremarkable in appearance. The kidneys are within normal limits. There is no evidence of hydronephrosis. No renal or ureteral stones are identified. No perinephric stranding is seen. Stomach/Bowel: There appears to be dilatation of the appendix to 1.1 cm in diameter on coronal images, raising concern for acute appendicitis. The appendix is retrocecal in nature, seen at the right hemipelvis. There is mild associated wall thickening at the cecum, and mild surrounding soft tissue inflammation. There is no definite evidence of perforation or abscess formation at this time. Appendix: Location: Retrocecal, at the right hemipelvis. Cecum is noted at the upper right hemipelvis. Diameter: 1.1 cm Appendicolith: No Mucosal hyper-enhancement: Question of minimal mucosal hyperenhancement Extraluminal gas: No Periappendiceal collection: No The colon is unremarkable in appearance. The small bowel is grossly unremarkable. The stomach is within normal limits. Vascular/Lymphatic: The  abdominal aorta is unremarkable in appearance. The inferior vena cava is grossly unremarkable. No retroperitoneal lymphadenopathy is seen. No pelvic sidewall lymphadenopathy is identified.  Reproductive: The bladder is decompressed and not well characterized. The uterus is unremarkable in appearance. The ovaries are relatively symmetric. No suspicious adnexal masses are seen. Trace free fluid within the pelvis may reflect the appendicitis or may be physiologic in nature. Other: No additional soft tissue abnormalities are seen. Musculoskeletal: No acute osseous abnormalities are identified. The visualized musculature is unremarkable in appearance. IMPRESSION: Dilatation of the appendix to 1.1 cm in diameter on coronal images, raising concern for acute appendicitis. The appendix is retrocecal in nature, seen at the right hemipelvis. No evidence of perforation or abscess formation. Mild associated wall thickening at the cecum. Surrounding soft tissue inflammation noted. These results were called by telephone at the time of interpretation on 05/21/2017 at 1:01 am to Dr. Marjean Donna, who verbally acknowledged these results. Electronically Signed   By: Garald Balding M.D.   On: 05/21/2017 01:07    Anti-infectives: Anti-infectives (From admission, onward)   Start     Dose/Rate Route Frequency Ordered Stop   05/21/17 0230  cefTRIAXone (ROCEPHIN) 2 g in dextrose 5 % 50 mL IVPB     2 g 100 mL/hr over 30 Minutes Intravenous Every 24 hours 05/21/17 0217     05/21/17 0230  metroNIDAZOLE (FLAGYL) IVPB 500 mg     500 mg 100 mL/hr over 60 Minutes Intravenous Every 8 hours 05/21/17 0217        Assessment/Plan:  CT personally reviewed.  Labs are personally reviewed.  Repeat potassium is currently pending.  Recommend and agree with plans for laparoscopic appendectomy today.  The rationale for this was discussed the options of observation reviewed and the risks were detailed as they had been detailed by Dr. Rosana Hoes as well.  I have discussed this with the operating room and trying to obtain OR time this morning.  Discussed with nursing.  Florene Glen, MD, FACS  05/21/2017

## 2017-05-21 NOTE — ED Notes (Signed)
Report off to [employer] rn floor nurse

## 2017-05-21 NOTE — Anesthesia Preprocedure Evaluation (Signed)
Anesthesia Evaluation  Patient identified by MRN, date of birth, ID band Patient awake    Reviewed: Allergy & Precautions, NPO status , Patient's Chart, lab work & pertinent test results  History of Anesthesia Complications Negative for: history of anesthetic complications  Airway Mallampati: II  TM Distance: >3 FB Neck ROM: Full    Dental no notable dental hx.    Pulmonary neg sleep apnea, neg COPD, Current Smoker,    breath sounds clear to auscultation- rhonchi (-) wheezing      Cardiovascular Exercise Tolerance: Good (-) hypertension(-) CAD and (-) Past MI  Rhythm:Regular Rate:Normal - Systolic murmurs and - Diastolic murmurs    Neuro/Psych negative neurological ROS  negative psych ROS   GI/Hepatic negative GI ROS, Neg liver ROS,   Endo/Other  negative endocrine ROSneg diabetes  Renal/GU negative Renal ROS     Musculoskeletal negative musculoskeletal ROS (+)   Abdominal (+) - obese,   Peds  Hematology negative hematology ROS (+)   Anesthesia Other Findings   Reproductive/Obstetrics                             Anesthesia Physical Anesthesia Plan  ASA: II  Anesthesia Plan: General   Post-op Pain Management:    Induction: Intravenous  PONV Risk Score and Plan: 1 and Dexamethasone and Ondansetron  Airway Management Planned: Oral ETT  Additional Equipment:   Intra-op Plan:   Post-operative Plan: Extubation in OR  Informed Consent: I have reviewed the patients History and Physical, chart, labs and discussed the procedure including the risks, benefits and alternatives for the proposed anesthesia with the patient or authorized representative who has indicated his/her understanding and acceptance.   Dental advisory given  Plan Discussed with: CRNA and Anesthesiologist  Anesthesia Plan Comments:         Anesthesia Quick Evaluation

## 2017-05-21 NOTE — ED Notes (Signed)
Dr Earlene Plateravis in with pt now.

## 2017-05-21 NOTE — Progress Notes (Signed)
Preoperative Review   Patient is met in the preoperative holding area. The history is reviewed in the chart and with the patient. I personally reviewed the options and rationale as well as the risks of this procedure that have been previously discussed with the patient. All questions asked by the patient and/or family were answered to their satisfaction.  Patient agrees to proceed with this procedure at this time.  Stephanie Navarro E Nazanin Kinner M.D. FACS  

## 2017-05-21 NOTE — H&P (Signed)
SURGICAL ADMISSION HISTORY & PHYSICAL  HISTORY OF PRESENT ILLNESS (HPI):  21 y.o. female [facility] ED for evaluation of abdominal pain. Patient reports acute onset of upper abdominal pain yesterday afternoon, since which it has progressed to become focused at her RLQ. Patient also reports nausea with non-bloody emesis, denies diarrhea or constipation, fever/chills, CP, or SOB. When told her diagnosis, patient first asked about results of her pregnancy test, seeming relieved but surprised that it was negative, and expressed eagerness to be admitted to hospital so she can sleep, saying she "can't sleep at home".  Surgery is consulted by ED physician Dr. Manson PasseyBrown in this context for evaluation and management of acute appendicitis.  PAST MEDICAL HISTORY (PMH):  History reviewed. No pertinent past medical history.   PAST SURGICAL HISTORY (PSH):  History reviewed. No pertinent surgical history.  Vaginal childbirth at age 21 years old  MEDICATIONS:  Prior to Admission medications   Not on File     ALLERGIES:  No Known Allergies   SOCIAL HISTORY:  Social History   Socioeconomic History  . Marital status: Single    Spouse name: Not on file  . Number of children: Not on file  . Years of education: Not on file  . Highest education level: Not on file  Social Needs  . Financial resource strain: Not on file  . Food insecurity - worry: Not on file  . Food insecurity - inability: Not on file  . Transportation needs - medical: Not on file  . Transportation needs - non-medical: Not on file  Occupational History  . Not on file  Tobacco Use  . Smoking status: Current Every Day Smoker  . Smokeless tobacco: Never Used  Substance and Sexual Activity  . Alcohol use: No    Frequency: Never  . Drug use: No  . Sexual activity: Yes  Other Topics Concern  . Not on file  Social History Narrative  . Not on file    The patient currently resides (home / rehab facility / nursing  home): Home The patient normally is (ambulatory / bedbound): Ambulatory   FAMILY HISTORY:  No family history on file.   REVIEW OF SYSTEMS:  Constitutional: denies weight loss, fever, chills, or sweats  Eyes: denies any other vision changes, history of eye injury  ENT: denies sore throat, hearing problems  Respiratory: denies shortness of breath, wheezing  Cardiovascular: denies chest pain, palpitations  Gastrointestinal: abdominal pain, N/V, and bowel function as per HPI Genitourinary: denies burning with urination or urinary frequency Musculoskeletal: denies any other joint pains or cramps  Skin: denies any other rashes or skin discolorations  Neurological: denies any other headache, dizziness, weakness  Psychiatric: denies any other depression, anxiety   All other review of systems were negative   VITAL SIGNS:  Temp:  [98.2 F (36.8 C)] 98.2 F (36.8 C) (12/11 2105) Pulse Rate:  [65] 65 (12/11 2105) Resp:  [18] 18 (12/11 2105) BP: (113)/(54) 113/54 (12/11 2105) SpO2:  [98 %] 98 % (12/11 2105) Weight:  [135 lb (61.2 kg)] 135 lb (61.2 kg) (12/11 2102)     Height: 5\' 3"  (160 cm) Weight: 135 lb (61.2 kg) BMI (Calculated): 23.92   INTAKE/OUTPUT:  This shift: No intake/output data recorded.  Last 2 shifts: @IOLAST2SHIFTS @   PHYSICAL EXAM:  Constitutional:  -- Normal body habitus  -- Awake, alert, and oriented x3, no apparent distress -- Under covers in bed with boyfriend in ED Eyes:  -- Pupils equally round and reactive  to light  -- No scleral icterus, B/L no occular discharge Ear, nose, throat: -- Neck is FROM WNL -- No jugular venous distension  Pulmonary:  -- No wheezes or rhales -- Equal breath sounds bilaterally -- Breathing non-labored at rest Cardiovascular:  -- S1, S2 present  -- No pericardial rubs  Gastrointestinal:  -- Abdomen soft and non-distended with mild-/moderate- RLQ abdominal tenderness to palpation, no guarding or rebound tenderness -- No  abdominal masses appreciated, pulsatile or otherwise  Musculoskeletal and Integumentary:  -- Wounds or skin discoloration: None appreciated -- Extremities: B/L UE and LE FROM, hands and feet warm, no edema  Neurologic:  -- Motor function: Intact and symmetric -- Sensation: Intact and symmetric Psychiatric:  -- Mood and affect WNL  Labs:  CBC Latest Ref Rng & Units 05/20/2017 06/07/2012 06/05/2012  WBC 3.6 - 11.0 K/uL 13.7(H) - 18.2(H)  Hemoglobin 12.0 - 16.0 g/dL 16.114.1 - 09.612.2  Hematocrit 35.0 - 47.0 % 42.2 34.2(L) 37.1  Platelets 150 - 440 K/uL 187 - 184   CMP Latest Ref Rng & Units 05/20/2017  Glucose 65 - 99 mg/dL 045(W120(H)  BUN 6 - 20 mg/dL 20  Creatinine 0.980.44 - 1.191.00 mg/dL 1.470.84  Sodium 829135 - 562145 mmol/L 138  Potassium 3.5 - 5.1 mmol/L 2.9(L)  Chloride 101 - 111 mmol/L 102  CO2 22 - 32 mmol/L 24  Calcium 8.9 - 10.3 mg/dL 9.6   Imaging studies:  CT Abdomen and Pelvis with Contrast (05/21/2017) - personally reviewed with patient and her boyfriend bedside Dilatation of the appendix to 1.1 cm in diameter on coronal images, raising concern for acute appendicitis. The appendix is retrocecal in nature, seen at the right hemipelvis. No evidence of perforation or abscess formation. Mild associated wall thickening at the cecum. Surrounding soft tissue inflammation noted.  Assessment/Plan: (ICD-10's: K35.3) 21 y.o. female with acute non-perforated appendicitis, complicated by hypokalemia, low-grade leukocytosis, and chronic ongoing tobacco abuse following childbirth at age 21 years old.              - NPO, IV fluids              - correct electrolytes (potassium)              - IV antibiotics (ceftriaxone + metronidazole)             all risks, benefits, and alternatives to appendectomy were discussed with the patient and her boyfriend (bedside), all of their questions were answered to their expressed satisfaction, patient expresses she wishes to proceed, and informed consent was  obtained.              - will plan for laparoscopic appendectomy tomorrow morning following repeat potassium level             - DVT prophylaxis  All of the above findings and recommendations were discussed with the patient and her boyfriend (bedside), and all of patient's and her family's questions were answered to their expressed satisfaction.  -- Scherrie GerlachJason E. Earlene Plateravis, MD, RPVI [place]: [facility] - Partnering for exceptional care. Office: [phone]

## 2017-05-21 NOTE — Anesthesia Postprocedure Evaluation (Signed)
Anesthesia Post Note  Patient: Stephanie Navarro  Procedure(s) Performed: APPENDECTOMY LAPAROSCOPIC (N/A )  Patient location during evaluation: PACU Anesthesia Type: General Level of consciousness: awake and alert Pain management: pain level controlled Vital Signs Assessment: post-procedure vital signs reviewed and stable Respiratory status: spontaneous breathing, nonlabored ventilation, respiratory function stable and patient connected to nasal cannula oxygen Cardiovascular status: blood pressure returned to baseline and stable Postop Assessment: no apparent nausea or vomiting Anesthetic complications: no     Last Vitals:  Vitals:   05/21/17 1730 05/21/17 1945  BP: 108/65 98/66  Pulse: 63 (!) 58  Resp: 18   Temp:  36.8 C  SpO2: 100% 99%    Last Pain:  Vitals:   05/21/17 2013  TempSrc:   PainSc: 0-No pain                 Deetta Siegmann S

## 2017-05-21 NOTE — Anesthesia Procedure Notes (Signed)
Procedure Name: Intubation Date/Time: 05/21/2017 2:28 PM Performed by: Eben Burow, CRNA Pre-anesthesia Checklist: Patient identified, Emergency Drugs available, Suction available, Patient being monitored and Timeout performed Patient Re-evaluated:Patient Re-evaluated prior to induction Oxygen Delivery Method: Circle system utilized Preoxygenation: Pre-oxygenation with 100% oxygen Induction Type: IV induction Ventilation: Mask ventilation without difficulty Laryngoscope Size: Mac and 3 Grade View: Grade I Tube type: Oral Tube size: 7.0 mm Number of attempts: 1 Airway Equipment and Method: Stylet and LTA kit utilized Placement Confirmation: ETT inserted through vocal cords under direct vision,  positive ETCO2 and breath sounds checked- equal and bilateral Secured at: 22 cm Tube secured with: Tape Dental Injury: Teeth and Oropharynx as per pre-operative assessment

## 2017-05-21 NOTE — Transfer of Care (Signed)
Immediate Anesthesia Transfer of Care Note  Patient: Stephanie LeatherwoodChelsea L Navarro  Procedure(s) Performed: Procedure(s): APPENDECTOMY LAPAROSCOPIC (N/A)  Patient Location: PACU  Anesthesia Type:General  Level of Consciousness: sedated  Airway & Oxygen Therapy: Patient Spontanous Breathing and Patient connected to face mask oxygen  Post-op Assessment: Report given to RN and Post -op Vital signs reviewed and stable  Post vital signs: Reviewed and stable  Last Vitals:  Vitals:   05/21/17 1318 05/21/17 1523  BP: 108/65 117/68  Pulse: 61 60  Resp: 16 18  Temp: 37.3 C 36.4 C  SpO2: 100% 100%    Complications: No apparent anesthesia complications

## 2017-05-21 NOTE — Anesthesia Post-op Follow-up Note (Signed)
Anesthesia QCDR form completed.        

## 2017-05-22 ENCOUNTER — Encounter: Payer: Self-pay | Admitting: Surgery

## 2017-05-22 LAB — HIV ANTIBODY (ROUTINE TESTING W REFLEX): HIV SCREEN 4TH GENERATION: NONREACTIVE

## 2017-05-22 MED ORDER — HYDROCODONE-ACETAMINOPHEN 5-325 MG PO TABS: 1.0000 | ORAL_TABLET | ORAL | 0 refills | Status: DC | PRN

## 2017-05-22 MED ORDER — MORPHINE SULFATE (PF) 4 MG/ML IV SOLN
2.0000 mg | INTRAVENOUS | Status: DC | PRN
  Administered 2017-05-22 (×2): 2 mg via INTRAVENOUS
  Filled 2017-05-22 (×2): qty 1

## 2017-05-22 NOTE — Care Management (Signed)
Patient status post lap appy.  Patient showing Medicaid as primary insurance.  Patient states "I think I have Medicaid, but I'm not sure."  Patient states that she [facility] to go to [facility], but stopped going.  Patient to discharge with script for pain medication. Patient provided with [email or web address] coupon. Out pf pocket cost $8.57.  RNCM singing off.

## 2017-05-22 NOTE — Discharge Instructions (Signed)
Remove dressing in 24 hours. °May shower in 24 hours. °Leave paper strips in place. °Resume all home medications. °Follow-up with Dr. Nassir Neidert in 10 days. °

## 2017-05-22 NOTE — Progress Notes (Signed)
1 Day Post-Op  Subjective: Status post laparoscopic appendectomy.  Patient has no complaints today except left lower quadrant incisional pain.  No nausea vomiting fevers or chills  Objective: Vital signs in last 24 hours: Temp:  [97.6 F (36.4 C)-99.1 F (37.3 C)] 98.1 F (36.7 C) (12/13 0600) Pulse Rate:  [51-105] 52 (12/13 0600) Resp:  [16-24] 18 (12/12 2302) BP: (86-121)/(40-75) 95/49 (12/13 0600) SpO2:  [99 %-100 %] 99 % (12/13 0600) Last BM Date: 05/20/17  Intake/Output from previous day: 12/12 0701 - 12/13 0700 In: 2729 [P.O.:540; I.V.:2189] Out: 110 [Urine:100; Blood:10] Intake/Output this shift: Total I/O In: 365 [I.V.:365] Out: 0   Physical exam:  Soft nontender abdomen wounds are dressed without drainage.  Calves are nontender  Lab Results: CBC  Recent Labs    05/20/17 2105 05/21/17 0715  WBC 13.7* 10.2  HGB 14.1 12.7  HCT 42.2 37.1  PLT 187 139*   BMET Recent Labs    05/20/17 2105 05/21/17 0715  NA 138 138  K 2.9* 3.6  CL 102 110  CO2 24 22  GLUCOSE 120* 112*  BUN 20 14  CREATININE 0.84 0.73  CALCIUM 9.6 8.8*   PT/INR No results for input(s): LABPROT, INR in the last 72 hours. ABG No results for input(s): PHART, HCO3 in the last 72 hours.  Invalid input(s): PCO2, PO2  Studies/Results: Dg Chest 2 View  Result Date: 05/20/2017 CLINICAL DATA:  Acute onset of right-sided rib pain.  Nausea. EXAM: CHEST  2 VIEW COMPARISON:  None. FINDINGS: The lungs are well-aerated and clear. There is no evidence of focal opacification, pleural effusion or pneumothorax. The heart is normal in size; the mediastinal contour is within normal limits. No acute osseous abnormalities are seen. IMPRESSION: No acute cardiopulmonary process seen. No displaced rib fractures identified. Electronically Signed   By: Roanna RaiderJeffery  Chang M.D.   On: 05/20/2017 21:36   Ct Abdomen Pelvis W Contrast  Result Date: 05/21/2017 CLINICAL DATA:  Acute onset of right-sided rib pain.   Nausea. EXAM: CT ABDOMEN AND PELVIS WITH CONTRAST TECHNIQUE: Multidetector CT imaging of the abdomen and pelvis was performed using the standard protocol following bolus administration of intravenous contrast. CONTRAST:  100mL ISOVUE-300 IOPAMIDOL (ISOVUE-300) INJECTION 61% COMPARISON:  Pelvic ultrasound performed 02/27/2012 FINDINGS: Lower chest: The visualized lung bases are grossly clear. The visualized portions of the mediastinum are unremarkable. Hepatobiliary: The liver is unremarkable in appearance. The gallbladder is unremarkable in appearance. The common bile duct remains normal in caliber. Pancreas: The pancreas is within normal limits. Spleen: The spleen is unremarkable in appearance. Adrenals/Urinary Tract: The adrenal glands are unremarkable in appearance. The kidneys are within normal limits. There is no evidence of hydronephrosis. No renal or ureteral stones are identified. No perinephric stranding is seen. Stomach/Bowel: There appears to be dilatation of the appendix to 1.1 cm in diameter on coronal images, raising concern for acute appendicitis. The appendix is retrocecal in nature, seen at the right hemipelvis. There is mild associated wall thickening at the cecum, and mild surrounding soft tissue inflammation. There is no definite evidence of perforation or abscess formation at this time. Appendix: Location: Retrocecal, at the right hemipelvis. Cecum is noted at the upper right hemipelvis. Diameter: 1.1 cm Appendicolith: No Mucosal hyper-enhancement: Question of minimal mucosal hyperenhancement Extraluminal gas: No Periappendiceal collection: No The colon is unremarkable in appearance. The small bowel is grossly unremarkable. The stomach is within normal limits. Vascular/Lymphatic: The abdominal aorta is unremarkable in appearance. The inferior vena cava is grossly  unremarkable. No retroperitoneal lymphadenopathy is seen. No pelvic sidewall lymphadenopathy is identified. Reproductive: The bladder  is decompressed and not well characterized. The uterus is unremarkable in appearance. The ovaries are relatively symmetric. No suspicious adnexal masses are seen. Trace free fluid within the pelvis may reflect the appendicitis or may be physiologic in nature. Other: No additional soft tissue abnormalities are seen. Musculoskeletal: No acute osseous abnormalities are identified. The visualized musculature is unremarkable in appearance. IMPRESSION: Dilatation of the appendix to 1.1 cm in diameter on coronal images, raising concern for acute appendicitis. The appendix is retrocecal in nature, seen at the right hemipelvis. No evidence of perforation or abscess formation. Mild associated wall thickening at the cecum. Surrounding soft tissue inflammation noted. These results were called by telephone at the time of interpretation on 05/21/2017 at 1:01 am to Dr. Bayard MalesANDOLPH BROWN, who verbally acknowledged these results. Electronically Signed   By: Roanna RaiderJeffery  Chang M.D.   On: 05/21/2017 01:07    Anti-infectives: Anti-infectives (From admission, onward)   Start     Dose/Rate Route Frequency Ordered Stop   05/21/17 0230  cefTRIAXone (ROCEPHIN) 2 g in dextrose 5 % 50 mL IVPB  Status:  Discontinued     2 g 100 mL/hr over 30 Minutes Intravenous Every 24 hours 05/21/17 0217 05/21/17 1628   05/21/17 0230  metroNIDAZOLE (FLAGYL) IVPB 500 mg  Status:  Discontinued     500 mg 100 mL/hr over 60 Minutes Intravenous Every 8 hours 05/21/17 0217 05/21/17 1628      Assessment/Plan: s/p Procedure(s): APPENDECTOMY LAPAROSCOPIC   Patient is doing well will advance diet discharge later today with instructions to follow-up in 10 days.  Lattie Hawichard E Mariya Mottley, MD, FACS  05/22/2017

## 2017-05-22 NOTE — Discharge Summary (Signed)
Physician Discharge Summary  Patient ID: Stephanie LeatherwoodChelsea L Navarro MRN: [id number] DOB/AGE: 19-02-1996 19 y.o.  Admit date: 05/20/2017 Discharge date: 05/22/2017   Discharge Diagnoses:  Active Problems:   Acute appendicitis with localized peritonitis   Procedures: Laparoscopic appendectomy  Hospital Course: This patient admitted to the hospital with diagnosis of acute appendicitis.  She was taken the operating room for laparoscopic appendectomy which confirmed the diagnosis.  Postoperatively she is tolerating a diet and will follow up in our office in 10 days with oral analgesics given for pain.  She may remove her dressings tomorrow and shower.  Consults: None  Disposition:    Allergies as of 05/22/2017   No Known Allergies     Medication List    TAKE these medications   HYDROcodone-acetaminophen 5-325 MG tablet Commonly known as:  NORCO/VICODIN Take 1 tablet by mouth every 4 (four) hours as needed for moderate pain.   NEXPLANON 68 MG Impl implant Generic drug:  etonogestrel 1 each by Subdermal route once.      Follow-up Information    Stephanie Navarro, Stephanie Markarian E, MD Follow up in 10 day(s).   Specialty:  Surgery Contact information: [address] [place] [address] [phone]           Stephanie Hawichard Navarro Leilynn Pilat, MD, FACS

## 2017-05-22 NOTE — Progress Notes (Signed)
Tiaria L Kittleson  A and O x 4. VSS. Pt tolerating diet well. No complaints of pain or nausea. IV removed intact, prescriptions given. Pt voiced understanding of discharge instructions with no further questions. Pt discharged via wheelchair with nurse aide.  Orvil FeilAbbie Chaye Misch MSN, RN-BC  Allergies as of 05/22/2017   No Known Allergies     Medication List    TAKE these medications   HYDROcodone-acetaminophen 5-325 MG tablet Commonly known as:  NORCO/VICODIN Take 1 tablet by mouth every 4 (four) hours as needed for moderate pain.   NEXPLANON 68 MG Impl implant Generic drug:  etonogestrel 1 each by Subdermal route once.       Vitals:   05/22/17 1005 05/22/17 1240  BP: 103/65 (!) 95/51  Pulse: (!) 56 (!) 52  Resp: 16 16  Temp: 98.4 F (36.9 C) 98.8 F (37.1 C)  SpO2: 99% 100%

## 2017-05-23 ENCOUNTER — Telehealth: Payer: Self-pay | Admitting: Surgery

## 2017-05-23 LAB — NASOPHARYNGEAL CULTURE: CULTURE: NORMAL

## 2017-05-23 LAB — SURGICAL PATHOLOGY

## 2017-05-23 NOTE — Telephone Encounter (Signed)
Left a message for the patient to call the office, patient needs to see Dr. Excell Seltzerooper and is on Dr. Luther BradleyWoodhams schedule if the patient calls back please r/s patient to Dr. Excell Seltzerooper scheudule.

## 2017-05-27 NOTE — Telephone Encounter (Signed)
Left another message for the patient to call the office, patient needs to see Dr. Excell Seltzerooper this week.

## 2017-05-29 ENCOUNTER — Encounter: Payer: Self-pay | Admitting: Surgery

## 2017-05-29 ENCOUNTER — Ambulatory Visit (INDEPENDENT_AMBULATORY_CARE_PROVIDER_SITE_OTHER): Payer: Medicaid Other | Admitting: Surgery

## 2017-05-29 VITALS — BP 109/74 | HR 69 | Temp 98.3°F | Wt 130.0 lb

## 2017-05-29 DIAGNOSIS — K3580 Unspecified acute appendicitis: Secondary | ICD-10-CM

## 2017-05-29 NOTE — Telephone Encounter (Signed)
Patients coming in today to see Dr. Excell Seltzerooper

## 2017-05-29 NOTE — Patient Instructions (Signed)

## 2017-05-29 NOTE — Progress Notes (Signed)
Outpatient postop visit  05/29/2017  Olena LeatherwoodChelsea L Vinje is an 21 y.o. female.    Procedure: Laparoscopic appendectomy  CC: Spotting  HPI: This patient status post laparoscopic appendectomy.  Pathology has been reviewed.  Patient feels well she has no nausea vomiting fevers or chills is eating well having normal bowel movements but has noticed some "spotting".  She has a birth control implant in place.  Medications reviewed.    Physical Exam:  LMP 05/01/2017 (Approximate) Comment: neg preg test    PE: Awake alert and oriented vital signs are stable and afebrile abdomen is soft nondistended nontympanitic and nontender wounds are clean no erythema no drainage minimal ecchymosis.    Assessment/Plan:  Status post laparoscopic appendectomy.  Patient doing very well.  She is tolerating a diet.  Pathology has been reviewed.  Patient is doing very well her pathology has been reviewed.  No sign of malignancy.  She can follow-up with us on as an as-needed basis.  I suggested that if she continues to spot she could see her gynecologist or the health department who put in the birth control implant. Lattie Hawichard E Jamile Rekowski, MD, FACS

## 2017-05-30 ENCOUNTER — Encounter: Payer: Self-pay | Admitting: General Surgery

## 2018-02-03 LAB — OB RESULTS CONSOLE HEPATITIS B SURFACE ANTIGEN: Hepatitis B Surface Ag: NEGATIVE

## 2018-06-05 DIAGNOSIS — Z62810 Personal history of physical and sexual abuse in childhood: Secondary | ICD-10-CM | POA: Insufficient documentation

## 2018-06-05 LAB — HM HIV SCREENING LAB: HM HIV Screening: NEGATIVE

## 2018-06-10 NOTE — L&D Delivery Note (Addendum)
Delivery Note At 11:18 PM a viable female was delivered via Vaginal, Spontaneous (Presentation:ROA).  APGAR: 8, 9; weight pending .   Placenta status: delivered spontaneously, intact.  Cord: 3VC with the following complications: none.  Cord pH: n/a  Anesthesia: epidural  Episiotomy: None Lacerations: 2nd degree;Vaginal;Perineal Suture Repair: 3.0 vicryl Est. Blood Loss (mL): 355  Mom to postpartum.  Baby to Couplet care / Skin to Skin.  Prentice Docker, MD 01/14/2019, 11:41 PM

## 2018-06-16 DIAGNOSIS — Z315 Encounter for genetic counseling: Secondary | ICD-10-CM

## 2018-06-16 HISTORY — DX: Encounter for procreative genetic counseling: Z31.5

## 2018-08-14 ENCOUNTER — Encounter: Payer: Medicaid Other | Admitting: Advanced Practice Midwife

## 2018-08-17 ENCOUNTER — Telehealth: Payer: Self-pay | Admitting: Obstetrics & Gynecology

## 2018-08-17 NOTE — Telephone Encounter (Signed)
We have received records on patient for transfer of care. Attempt to reach patient number on file is no longer in service.

## 2018-08-17 NOTE — Telephone Encounter (Signed)
Left generic message on mother's cellphone for patient to contact our office

## 2018-08-19 ENCOUNTER — Ambulatory Visit (INDEPENDENT_AMBULATORY_CARE_PROVIDER_SITE_OTHER): Payer: Medicaid Other | Admitting: Advanced Practice Midwife

## 2018-08-19 ENCOUNTER — Ambulatory Visit (INDEPENDENT_AMBULATORY_CARE_PROVIDER_SITE_OTHER): Payer: Medicaid Other

## 2018-08-19 ENCOUNTER — Encounter: Payer: Self-pay | Admitting: Advanced Practice Midwife

## 2018-08-19 ENCOUNTER — Other Ambulatory Visit (HOSPITAL_COMMUNITY)
Admission: RE | Admit: 2018-08-19 | Discharge: 2018-08-19 | Disposition: A | Discharge status: Home / Self Care | Payer: Medicaid Other | Source: Ambulatory Visit | Attending: Advanced Practice Midwife | Admitting: Advanced Practice Midwife

## 2018-08-19 ENCOUNTER — Other Ambulatory Visit: Payer: Self-pay

## 2018-08-19 VITALS — BP 100/60 | Wt 136.0 lb

## 2018-08-19 DIAGNOSIS — Z3A20 20 weeks gestation of pregnancy: Secondary | ICD-10-CM

## 2018-08-19 DIAGNOSIS — Z3482 Encounter for supervision of other normal pregnancy, second trimester: Secondary | ICD-10-CM

## 2018-08-19 DIAGNOSIS — Z363 Encounter for antenatal screening for malformations: Secondary | ICD-10-CM

## 2018-08-19 DIAGNOSIS — Z3480 Encounter for supervision of other normal pregnancy, unspecified trimester: Secondary | ICD-10-CM | POA: Diagnosis not present

## 2018-08-19 DIAGNOSIS — Z113 Encounter for screening for infections with a predominantly sexual mode of transmission: Secondary | ICD-10-CM | POA: Diagnosis present

## 2018-08-19 LAB — POCT URINALYSIS DIPSTICK OB
Glucose, UA: NEGATIVE
POC,PROTEIN,UA: NEGATIVE

## 2018-08-19 NOTE — Patient Instructions (Signed)
Second Trimester of Pregnancy The second trimester is from week 14 through week 27 (months 4 through 6). The second trimester is often a time when you feel your best. Your body has adjusted to being pregnant, and you begin to feel better physically. Usually, morning sickness has lessened or quit completely, you may have more energy, and you may have an increase in appetite. The second trimester is also a time when the fetus is growing rapidly. At the end of the sixth month, the fetus is about 9 inches long and weighs about 1 pounds. You will likely begin to feel the baby move (quickening) between 16 and 20 weeks of pregnancy. Body changes during your second trimester Your body continues to go through many changes during your second trimester. The changes vary from woman to woman.  Your weight will continue to increase. You will notice your lower abdomen bulging out.  You may begin to get stretch marks on your hips, abdomen, and breasts.  You may develop headaches that can be relieved by medicines. The medicines should be approved by your health care provider.  You may urinate more often because the fetus is pressing on your bladder.  You may develop or continue to have heartburn as a result of your pregnancy.  You may develop constipation because certain hormones are causing the muscles that push waste through your intestines to slow down.  You may develop hemorrhoids or swollen, bulging veins (varicose veins).  You may have back pain. This is caused by: ? Weight gain. ? Pregnancy hormones that are relaxing the joints in your pelvis. ? A shift in weight and the muscles that support your balance.  Your breasts will continue to grow and they will continue to become tender.  Your gums may bleed and may be sensitive to brushing and flossing.  Dark spots or blotches (chloasma, mask of pregnancy) may develop on your face. This will likely fade after the baby is born.  A dark line from your  belly button to the pubic area (linea nigra) may appear. This will likely fade after the baby is born.  You may have changes in your hair. These can include thickening of your hair, rapid growth, and changes in texture. Some women also have hair loss during or after pregnancy, or hair that feels dry or thin. Your hair will most likely return to normal after your baby is born. What to expect at prenatal visits During a routine prenatal visit:  You will be weighed to make sure you and the fetus are growing normally.  Your blood pressure will be taken.  Your abdomen will be measured to track your baby's growth.  The fetal heartbeat will be listened to.  Any test results from the previous visit will be discussed. Your health care provider may ask you:  How you are feeling.  If you are feeling the baby move.  If you have had any abnormal symptoms, such as leaking fluid, bleeding, severe headaches, or abdominal cramping.  If you are using any tobacco products, including cigarettes, chewing tobacco, and electronic cigarettes.  If you have any questions. Other tests that may be performed during your second trimester include:  Blood tests that check for: ? Low iron levels (anemia). ? High blood sugar that affects pregnant women (gestational diabetes) between 35 and 28 weeks. ? Rh antibodies. This is to check for a protein on red blood cells (Rh factor).  Urine tests to check for infections, diabetes, or protein in the  urine.  An ultrasound to confirm the proper growth and development of the baby.  An amniocentesis to check for possible genetic problems.  Fetal screens for spina bifida and Down syndrome.  HIV (human immunodeficiency virus) testing. Routine prenatal testing includes screening for HIV, unless you choose not to have this test. Follow these instructions at home: Medicines  Follow your health care provider's instructions regarding medicine use. Specific medicines may be  either safe or unsafe to take during pregnancy.  Take a prenatal vitamin that contains at least 600 micrograms (mcg) of folic acid.  If you develop constipation, try taking a stool softener if your health care provider approves. Eating and drinking   Eat a balanced diet that includes fresh fruits and vegetables, whole grains, good sources of protein such as meat, eggs, or tofu, and low-fat dairy. Your health care provider will help you determine the amount of weight gain that is right for you.  Avoid raw meat and uncooked cheese. These carry germs that can cause birth defects in the baby.  If you have low calcium intake from food, talk to your health care provider about whether you should take a daily calcium supplement.  Limit foods that are high in fat and processed sugars, such as fried and sweet foods.  To prevent constipation: ? Drink enough fluid to keep your urine clear or pale yellow. ? Eat foods that are high in fiber, such as fresh fruits and vegetables, whole grains, and beans. Activity  Exercise only as directed by your health care provider. Most women can continue their usual exercise routine during pregnancy. Try to exercise for 30 minutes at least 5 days a week. Stop exercising if you experience uterine contractions.  Avoid heavy lifting, wear low heel shoes, and practice good posture.  A sexual relationship may be continued unless your health care provider directs you otherwise. Relieving pain and discomfort  Wear a good support bra to prevent discomfort from breast tenderness.  Take warm sitz baths to soothe any pain or discomfort caused by hemorrhoids. Use hemorrhoid cream if your health care provider approves.  Rest with your legs elevated if you have leg cramps or low back pain.  If you develop varicose veins, wear support hose. Elevate your feet for 15 minutes, 3-4 times a day. Limit salt in your diet. Prenatal Care  Write down your questions. Take them to  your prenatal visits.  Keep all your prenatal visits as told by your health care provider. This is important. Safety  Wear your seat belt at all times when driving.  Make a list of emergency phone numbers, including numbers for family, friends, the hospital, and police and fire departments. General instructions  Ask your health care provider for a referral to a local prenatal education class. Begin classes no later than the beginning of month 6 of your pregnancy.  Ask for help if you have counseling or nutritional needs during pregnancy. Your health care provider can offer advice or refer you to specialists for help with various needs.  Do not use hot tubs, steam rooms, or saunas.  Do not douche or use tampons or scented sanitary pads.  Do not cross your legs for long periods of time.  Avoid cat litter boxes and soil used by cats. These carry germs that can cause birth defects in the baby and possibly loss of the fetus by miscarriage or stillbirth.  Avoid all smoking, herbs, alcohol, and unprescribed drugs. Chemicals in these products can affect the formation  and growth of the baby.  Do not use any products that contain nicotine or tobacco, such as cigarettes and e-cigarettes. If you need help quitting, ask your health care provider.  Visit your dentist if you have not gone yet during your pregnancy. Use a soft toothbrush to brush your teeth and be gentle when you floss. Contact a health care provider if:  You have dizziness.  You have mild pelvic cramps, pelvic pressure, or nagging pain in the abdominal area.  You have persistent nausea, vomiting, or diarrhea.  You have a bad smelling vaginal discharge.  You have pain when you urinate. Get help right away if:  You have a fever.  You are leaking fluid from your vagina.  You have spotting or bleeding from your vagina.  You have severe abdominal cramping or pain.  You have rapid weight gain or weight loss.  You have  shortness of breath with chest pain.  You notice sudden or extreme swelling of your face, hands, ankles, feet, or legs.  You have not felt your baby move in over an hour.  You have severe headaches that do not go away when you take medicine.  You have vision changes. Summary  The second trimester is from week 14 through week 27 (months 4 through 6). It is also a time when the fetus is growing rapidly.  Your body goes through many changes during pregnancy. The changes vary from woman to woman.  Avoid all smoking, herbs, alcohol, and unprescribed drugs. These chemicals affect the formation and growth your baby.  Do not use any tobacco products, such as cigarettes, chewing tobacco, and e-cigarettes. If you need help quitting, ask your health care provider.  Contact your health care provider if you have any questions. Keep all prenatal visits as told by your health care provider. This is important. This information is not intended to replace advice given to you by your health care provider. Make sure you discuss any questions you have with your health care provider. Document Released: 05/21/2001 Document Revised: 07/02/2016 Document Reviewed: 07/02/2016 Elsevier Interactive Patient Education  2019 [employer] Exercise During Pregnancy For people of all ages, exercise is an important part of being healthy. Exercise improves heart and lung function and helps to maintain strength, flexibility, and a healthy body weight. Exercise also boosts energy levels and elevates mood. For most women, maintaining an exercise routine throughout pregnancy is recommended. It is only on rare occasions and with certain medical conditions or pregnancy complications that women may be asked to limit or avoid exercise during pregnancy. What are some other benefits to exercising during pregnancy? Along with maintaining strength and flexibility, exercising throughout pregnancy can help to:  Keep strength in muscles  that are very important during labor and childbirth.  Decrease low back pain during pregnancy.  Decrease the risk of developing gestational diabetes mellitus (GDM).  Improve blood sugar (glucose) control for women who have GDM.  Decrease the risk of developing preeclampsia. This is a serious condition that causes high blood pressure along with other symptoms, such as swelling and headaches.  Decrease the risk of cesarean delivery.  Speed up the recovery after giving birth. How often should I exercise? Unless your health care provider gives you different instructions, you should try to exercise on most days or all days of the week. In general, try to exercise with moderate intensity for about 150 minutes per week. This can be spread out across several days, such as exercising for 30 minutes  per day on 5 days of each week. You can tell that you are exercising at a moderate intensity if you have a higher heart rate and faster breathing, but you are still able to hold a conversation. What types of moderate-intensity exercise are recommended during pregnancy? There are many types of exercise that are safe for you to do during pregnancy. Unless your health care provider gives you different instructions, do a variety of exercises that safely increase your heart and breathing (cardiopulmonary) rates and help you to build and maintain muscle strength (strength training). You should always be able to talk in full sentences while exercising during pregnancy. Some examples of exercising that is safe to do during pregnancy include:  Brisk walking or hiking.  Swimming.  Water aerobics.  Riding a stationary bike.  Strength training.  Modified yoga or Pilates. Tell your instructor that you are pregnant. Avoid overstretching and avoid lying on your back for long periods of time.  Running or jogging. Only choose this type of exercise if: ? You ran or jogged regularly before your pregnancy. ? You can  run or jog and still talk in complete sentences. What types of exercise should I not do during pregnancy? Depending on your level of fitness and whether you exercised regularly before your pregnancy, you may be advised to limit vigorous-intensity exercise during your pregnancy. You can tell that you are exercising at a vigorous intensity if you are breathing much harder and faster and cannot hold a conversation while exercising. Some examples of exercising that you should avoid during pregnancy include:  Contact sports.  Activities that place you at risk for falling on or being hit in the belly, such as downhill skiing, water skiing, surfing, rock climbing, cycling, gymnastics, and horseback riding.  Scuba diving.  Sky diving.  Yoga or Pilates in a room that is heated to extreme temperatures ("hot yoga" or "hot Pilates").  Jogging or running, unless you ran or jogged regularly before your pregnancy. While jogging or running, you should always be able to talk in full sentences. Do not run or jog so vigorously that you are unable to have a conversation.  If you are not used to exercising at elevation (more than 6,000 feet above sea level), do not do so during your pregnancy. When should I avoid exercising during pregnancy? Certain medical conditions can make it unsafe to exercise during pregnancy, or they may increase your risk of miscarriage or early labor and birth. Some of these conditions include:  Some types of heart disease.  Some types of lung disease.  Placenta previa. This is when the placenta partially or completely covers the opening of the uterus (cervix).  Frequent bleeding from the vagina during your pregnancy.  Incompetent cervix. This is when your cervix does not remain as tightly closed during pregnancy as it should.  Premature labor.  Ruptured membranes. This is when the protective sac (amniotic sac) opens up and amniotic fluid leaks from your vagina.  Severely low  blood count (anemia).  Preeclampsia or pregnancy-caused high blood pressure.  Carrying more than one baby (multiple gestation) and having an additional risk of early labor.  Poorly controlled diabetes.  Being severely underweight or severely overweight.  Intrauterine growth restriction. This is when your baby's growth and development during pregnancy are slower than expected.  Other medical conditions. Ask your health care provider if any apply to you. What else should I know about exercising during pregnancy? You should take these precautions while exercising during  pregnancy:  Avoid overheating. ? Wear loose-fitting, breathable clothes. ? Do not exercise in very high temperatures.  Avoid dehydration. Drink enough water before, during, and after exercise to keep your urine clear or pale yellow.  Avoid overstretching. Because of hormone changes during pregnancy, it is easy to overstretch muscles, tendons, and ligaments during pregnancy.  Start slowly and ask your health care provider to recommend types of exercise that are safe for you, if exercising regularly is new for you. Pregnancy is not a time for exercising to lose weight. When should I seek medical care? You should stop exercising and call your health care provider if you have any unusual symptoms, such as:  Mild uterine contractions or abdominal cramping.  Dizziness that does not improve with rest. When should I seek immediate medical care? You should stop exercising and call your local emergency services (911 in the [place] if you have any unusual symptoms, such as:  Sudden, severe pain in your low back or your belly.  Uterine contractions or abdominal cramping that do not improve with rest.  Chest pain.  Bleeding or fluid leaking from your vagina.  Shortness of breath. This information is not intended to replace advice given to you by your health care provider. Make sure you discuss any questions you have with  your health care provider. Document Released: 05/27/2005 Document Revised: 10/25/2015 Document Reviewed: 08/04/2014 Elsevier Interactive Patient Education  2019 [employer] Eating Plan for Pregnant Women While you are pregnant, your body requires additional nutrition to help support your growing baby. You also have a higher need for some vitamins and minerals, such as folic acid, calcium, iron, and vitamin D. Eating a healthy, well-balanced diet is very important for your health and your baby's health. Your need for extra calories varies for the three 8360-month segments of your pregnancy (trimesters). For most women, it is recommended to consume:  150 extra calories a day during the first trimester.  300 extra calories a day during the second trimester.  300 extra calories a day during the third trimester. What are tips for following this plan?   Do not try to lose weight or go on a diet during pregnancy.  Limit your overall intake of foods that have "empty calories." These are foods that have little nutritional value, such as sweets, desserts, candies, and sugar-sweetened beverages.  Eat a variety of foods (especially fruits and vegetables) to get a full range of vitamins and minerals.  Take a prenatal vitamin to help meet your additional vitamin and mineral needs during pregnancy, specifically for folic acid, iron, calcium, and vitamin D.  Remember to stay active. Ask your health care provider what types of exercise and activities are safe for you.  Practice good food safety and cleanliness. Wash your hands before you eat and after you prepare raw meat. Wash all fruits and vegetables well before peeling or eating. Taking these actions can help to prevent food-borne illnesses that can be very dangerous to your baby, such as listeriosis. Ask your health care provider for more information about listeriosis. What does 150 extra calories look like? Healthy options that provide 150 extra  calories each day could be any of the following:  6-8 oz (170-230 g) of plain low-fat yogurt with  cup of berries.  1 apple with 2 teaspoons (11 g) of peanut butter.  Cut-up vegetables with  cup (60 g) of hummus.  8 oz (230 mL) or 1 cup of low-fat chocolate milk.  1 stick of string  cheese with 1 medium orange.  1 peanut butter and jelly sandwich that is made with one slice of whole-wheat bread and 1 tsp (5 g) of peanut butter. For 300 extra calories, you could eat two of those healthy options each day. What is a healthy amount of weight to gain? The right amount of weight gain for you is based on your BMI before you became pregnant. If your BMI:  Was less than 18 (underweight), you should gain 28-40 lb (13-18 kg).  Was 18-24.9 (normal), you should gain 25-35 lb (11-16 kg).  Was 25-29.9 (overweight), you should gain 15-25 lb (7-11 kg).  Was 30 or greater (obese), you should gain 11-20 lb (5-9 kg). What if I am having twins or multiples? Generally, if you are carrying twins or multiples:  You may need to eat 300-600 extra calories a day.  The recommended range for total weight gain is 25-54 lb (11-25 kg), depending on your BMI before pregnancy.  Talk with your health care provider to find out about nutritional needs, weight gain, and exercise that is right for you. What foods can I eat?  Grains All grains. Choose whole grains, such as whole-wheat bread, oatmeal, or brown rice. Vegetables All vegetables. Eat a variety of colors and types of vegetables. Remember to wash your vegetables well before peeling or eating. Fruits All fruits. Eat a variety of colors and types of fruit. Remember to wash your fruits well before peeling or eating. Meats and other protein foods Lean meats, including chicken, [place], fish, and lean cuts of beef, veal, or pork. If you eat fish or seafood, choose options that are higher in omega-3 fatty acids and lower in mercury, such as salmon, herring,  mussels, trout, sardines, pollock, shrimp, crab, and lobster. Tofu. Tempeh. Beans. Eggs. Peanut butter and other nut butters. Make sure that all meats, poultry, and eggs are cooked to food-safe temperatures or "well-done." Two or more servings of fish are recommended each week in order to get the most benefits from omega-3 fatty acids that are found in seafood. Choose fish that are lower in mercury. You can find more information online:  PumpkinSearch.com.ee Dairy Pasteurized milk and milk alternatives (such as almond milk). Pasteurized yogurt and pasteurized cheese. Cottage cheese. Sour cream. Beverages Water. Juices that contain 100% fruit juice or vegetable juice. Caffeine-free teas and decaffeinated coffee. Drinks that contain caffeine are okay to drink, but it is better to avoid caffeine. Keep your total caffeine intake to less than 200 mg each day (which is 12 oz or 355 mL of coffee, tea, or soda) or the limit as told by your health care provider. Fats and oils Fats and oils are okay to include in moderation. Sweets and desserts Sweets and desserts are okay to include in moderation. Seasoning and other foods All pasteurized condiments. The items listed above may not be a complete list of recommended foods and beverages. Contact your dietitian for more options. What foods are not recommended? Vegetables Raw (unpasteurized) vegetable juices. Fruits Unpasteurized fruit juices. Meats and other protein foods Lunch meats, bologna, hot dogs, or other deli meats. (If you must eat those meats, reheat them until they are steaming hot.) Refrigerated pat, meat spreads from a meat counter, smoked seafood that is found in the refrigerated section of a store. Raw or undercooked meats, poultry, and eggs. Raw fish, such as sushi or sashimi. Fish that have high mercury content, such as tilefish, shark, swordfish, and king mackerel. To learn more about mercury in  fish, talk with your health care provider or look  for online resources, such as:  [place]Gaming.ch Dairy Raw (unpasteurized) milk and any foods that have raw milk in them. Soft cheeses, such as feta, queso blanco, queso fresco, Brie, Camembert cheeses, blue-veined cheeses, and Panela cheese (unless it is made with pasteurized milk, which must be stated on the label). Beverages Alcohol. Sugar-sweetened beverages, such as sodas, teas, or energy drinks. Seasoning and other foods Homemade fermented foods and drinks, such as pickles, sauerkraut, or kombucha drinks. (Store-bought pasteurized versions of these are okay.) Salads that are made in a store or deli, such as ham salad, chicken salad, egg salad, tuna salad, and seafood salad. The items listed above may not be a complete list of foods and beverages to avoid. Contact your dietitian for more information. Where to find more information To calculate the number of calories you need based on your height, weight, and activity level, you can use an online calculator such as:  MobileTransition.ch To calculate how much weight you should gain during pregnancy, you can use an online pregnancy weight gain calculator such as:  StreamingFood.com.cy Summary  While you are pregnant, your body requires additional nutrition to help support your growing baby.  Eat a variety of foods, especially fruits and vegetables to get a full range of vitamins and minerals.  Practice good food safety and cleanliness. Wash your hands before you eat and after you prepare raw meat. Wash all fruits and vegetables well before peeling or eating. Taking these actions can help to prevent food-borne illnesses, such as listeriosis, that can be very dangerous to your baby.  Do not eat raw meat or fish. Do not eat fish that have high mercury content, such as tilefish, shark, swordfish, and king mackerel. Do not eat unpasteurized (raw) dairy.  Take a prenatal vitamin to help meet your  additional vitamin and mineral needs during pregnancy, specifically for folic acid, iron, calcium, and vitamin D. This information is not intended to replace advice given to you by your health care provider. Make sure you discuss any questions you have with your health care provider. Document Released: 03/11/2014 Document Revised: 02/21/2017 Document Reviewed: 02/21/2017 Elsevier Interactive Patient Education  2019 [employer]. Prenatal Care Prenatal care is health care during pregnancy. It helps you and your unborn baby (fetus) stay as healthy as possible. Prenatal care may be provided by a midwife, a family practice health care provider, or a childbirth and pregnancy specialist (obstetrician). How does this affect me? During pregnancy, you will be closely monitored for any new conditions that might develop. To lower your risk of pregnancy complications, you and your health care provider will talk about any underlying conditions you have. How does this affect my baby? Early and consistent prenatal care increases the chance that your baby will be healthy during pregnancy. Prenatal care lowers the risk that your baby will be:  Born early (prematurely).  Smaller than expected at birth (small for gestational age). What can I expect at the first prenatal care visit? Your first prenatal care visit will likely be the longest. You should schedule your first prenatal care visit as soon as you know that you are pregnant. Your first visit is a good time to talk about any questions or concerns you have about pregnancy. At your visit, you and your health care provider will talk about:  Your medical history, including: ? Any past pregnancies. ? Your family's medical history. ? The baby's father's medical history. ? Any long-term (  chronic) health conditions you have and how you manage them. ? Any surgeries or procedures you have had. ? Any current over-the-counter or prescription medicines, herbs, or  supplements you are taking.  Other factors that could pose a risk to your baby, including:  Your home setting and your stress levels, including: ? Exposure to abuse or violence. ? Household financial strain. ? Mental health conditions you have.  Your daily health habits, including diet and exercise. Your health care provider will also:  Measure your weight, height, and blood pressure.  Do a physical exam, including a pelvic and breast exam.  Perform blood tests and urine tests to check for: ? Urinary tract infection. ? Sexually transmitted infections (STIs). ? Low iron levels in your blood (anemia). ? Blood type and certain proteins on red blood cells (Rh antibodies). ? Infections and immunity to viruses, such as hepatitis B and rubella. ? HIV (human immunodeficiency virus).  Do an ultrasound to confirm your baby's growth and development and to help predict your estimated due date (EDD). This ultrasound is done with a probe that is inserted into the vagina (transvaginal ultrasound).  Discuss your options for genetic screening.  Give you information about how to keep yourself and your baby healthy, including: ? Nutrition and taking vitamins. ? Physical activity. ? How to manage pregnancy symptoms such as nausea and vomiting (morning sickness). ? Infections and substances that may be harmful to your baby and how to avoid them. ? Food safety. ? Dental care. ? Working. ? Travel. ? Warning signs to watch for and when to call your health care provider. How often will I have prenatal care visits? After your first prenatal care visit, you will have regular visits throughout your pregnancy. The visit schedule is often as follows:  Up to week 28 of pregnancy: once every 4 weeks.  28-36 weeks: once every 2 weeks.  After 36 weeks: every week until delivery. Some women may have visits more or less often depending on any underlying health conditions and the health of the baby. Keep  all follow-up and prenatal care visits as told by your health care provider. This is important. What happens during routine prenatal care visits? Your health care provider will:  Measure your weight and blood pressure.  Check for fetal heart sounds.  Measure the height of your uterus in your abdomen (fundal height). This may be measured starting around week 20 of pregnancy.  Check the position of your baby inside your uterus.  Ask questions about your diet, sleeping patterns, and whether you can feel the baby move.  Review warning signs to watch for and signs of labor.  Ask about any pregnancy symptoms you are having and how you are dealing with them. Symptoms may include: ? Headaches. ? Nausea and vomiting. ? Vaginal discharge. ? Swelling. ? Fatigue. ? Constipation. ? Any discomfort, including back or pelvic pain. Make a list of questions to ask your health care provider at your routine visits. What tests might I have during prenatal care visits? You may have blood, urine, and imaging tests throughout your pregnancy, such as:  Urine tests to check for glucose, protein, or signs of infection.  Glucose tests to check for a form of diabetes that can develop during pregnancy (gestational diabetes mellitus). This is usually done around week 24 of pregnancy.  An ultrasound to check your baby's growth and development and to check for birth defects. This is usually done around week 20 of pregnancy.  A test  to check for group B strep (GBS) infection. This is usually done around week 36 of pregnancy.  Genetic testing. This may include blood or imaging tests, such as an ultrasound. Some genetic tests are done during the first trimester and some are done during the second trimester. What else can I expect during prenatal care visits? Your health care provider may recommend getting certain vaccines during pregnancy. These may include:  A yearly flu shot (annual influenza vaccine). This is  especially important if you will be pregnant during flu season.  Tdap (tetanus, diphtheria, pertussis) vaccine. Getting this vaccine during pregnancy can protect your baby from whooping cough (pertussis) after birth. This vaccine may be recommended between weeks 27 and 36 of pregnancy. Later in your pregnancy, your health care provider may give you information about:  Childbirth and breastfeeding classes.  Choosing a health care provider for your baby.  Umbilical cord banking.  Breastfeeding.  Birth control after your baby is born.  The hospital labor and delivery unit and how to tour it.  Registering at the hospital before you go into labor. Where to find more information  Office on Women's Health: TravelLesson.cawomenshealth.gov  American Pregnancy Association: americanpregnancy.org  March of Dimes: marchofdimes.org Summary  Prenatal care helps you and your baby stay as healthy as possible during pregnancy.  Your first prenatal care visit will most likely be the longest.  You will have visits and tests throughout your pregnancy to monitor your health and your baby's health.  Bring a list of questions to your visits to ask your health care provider.  Make sure to keep all follow-up and prenatal care visits with your health care provider. This information is not intended to replace advice given to you by your health care provider. Make sure you discuss any questions you have with your health care provider. Document Released: 05/30/2003 Document Revised: 05/26/2017 Document Reviewed: 05/26/2017 Elsevier Interactive Patient Education  2019 [employer]

## 2018-08-19 NOTE — Progress Notes (Addendum)
New Obstetric Patient H&P    Chief Complaint: "Desires prenatal care" Transfer of care from [facility] Department   History of Present Illness: Patient is a 23 y.o. G2P1001 Not Hispanic or Latino female, presents with amenorrhea and positive home pregnancy test. Patient's last menstrual period was 03/29/2018 (within weeks). and based on her  LMP, her EDD is Estimated Date of Delivery: 01/03/19 and her EGA is [redacted]w[redacted]d. She has not yet had an ultrasound during this pregnancy. Cycles are 5. days, regular, and occur approximately every : 28 days. She has never had a PAP smear since she is not yet 23.    She had a urine pregnancy test which was positive 2 or 3 month(s)  ago. Her last menstrual period was normal and lasted for  5 day(s). Since her LMP she claims she has experienced breast tenderness, nausea, vomiting. She denies vaginal bleeding. Her past medical history is noncontributory. Her prior pregnancies are notable for full term female SVD 06/06/2012, 7#2oz.  Since her LMP, she admits to the use of tobacco products  She smokes 4 c/d She claims she has gained   10 pounds since the start of her pregnancy.  There are cats in the home in the home  no  She admits close contact with children on a regular basis  yes  She has had chicken pox in the past unknown She has had Tuberculosis exposures, symptoms, or previously tested positive for TB   no Current or past history of domestic violence. no  Genetic Screening/Teratology Counseling: (Includes patient, baby's father, or anyone in either family with:)   1. Patient's age >/= 68 at [facility]  no 2. Thalassemia ([place], [place], Mediterranean, or Asian background): MCV<80  no 3. Neural tube defect (meningomyelocele, spina bifida, anencephaly)  no 4. Congenital heart defect  no  5. Down syndrome  no 6. Tay-Sachs (Jewish, [place]  no 7. Canavan's Disease  no 8. Sickle cell disease or trait (African)  no  9. Hemophilia or other blood  disorders  no  10. Muscular dystrophy  no  11. Cystic fibrosis  no  12. Huntington's Chorea  no  13. Mental retardation/autism  no 14. Other inherited genetic or chromosomal disorder  no 15. Maternal metabolic disorder (DM, PKU, etc)  no 16. Patient or FOB with a child with a birth defect not listed above no  16a. Patient or FOB with a birth defect themselves no 17. Recurrent pregnancy loss, or stillbirth  no  18. Any medications since LMP other than prenatal vitamins (include vitamins, supplements, OTC meds, drugs, alcohol)  no 19. Any other genetic/environmental exposure to discuss  no  Infection History:   1. Lives with someone with TB or TB exposed  no  2. Patient or partner has history of genital herpes  no 3. Rash or viral illness since LMP  no 4. History of STI (GC, CT, HPV, syphilis, HIV)  Chlam[facility]. TOC today 5. History of recent travel :  no  Other pertinent information:  Patient prefers to deliver at [facility] but is not certain. Recommended she transfer care to [facility] providers for remainder of pregnancy if she decides on delivery at [facility].    Review of Systems:10 point review of systems negative unless otherwise noted in HPI  Past Medical History:  History reviewed. No pertinent past medical history.  Past Surgical History:  Past Surgical History:  Procedure Laterality Date  . APPENDECTOMY  05/21/2017   Dr.  Cooper   . LAPAROSCOPIC APPENDECTOMY N/A 05/21/2017   Procedure: APPENDECTOMY LAPAROSCOPIC;  Surgeon: Lattie Haw, MD;  Location: [facility] ORS;  Service: General;  Laterality: N/A;    Gynecologic History: Patient's last menstrual period was 03/29/2018 (within weeks).  Obstetric History: G2P1001  Family History:  Maternal great grandmother: breast cancer Sister: asthma  Social History:  Social History   Socioeconomic History  . Marital status: Single    Spouse name: Not on file  . Number of children: Not on file  . Years  of education: Not on file  . Highest education level: Not on file  Occupational History  . Not on file  Social Needs  . Financial resource strain: Not on file  . Food insecurity:    Worry: Not on file    Inability: Not on file  . Transportation needs:    Medical: Not on file    Non-medical: Not on file  Tobacco Use  . Smoking status: Current Every Day Smoker  . Smokeless tobacco: Never Used  Substance and Sexual Activity  . Alcohol use: No    Frequency: Never  . Drug use: No  . Sexual activity: Yes  Lifestyle  . Physical activity:    Days per week: Not on file    Minutes per session: Not on file  . Stress: Not on file  Relationships  . Social connections:    Talks on phone: Not on file    Gets together: Not on file    Attends religious service: Not on file    Active member of club or organization: Not on file    Attends meetings of clubs or organizations: Not on file    Relationship status: Not on file  . Intimate partner violence:    Fear of current or ex partner: Not on file    Emotionally abused: Not on file    Physically abused: Not on file    Forced sexual activity: Not on file  Other Topics Concern  . Not on file  Social History Narrative  . Not on file    Allergies:  No Known Allergies  Medications: Prior to Admission medications   Medication Sig Start Date End Date Taking? Authorizing Provider  Prenatal Vit-Fe Fumarate-FA (PNV PRENATAL PLUS MULTIVITAMIN) 27-1 MG TABS Take by mouth.    [provider]    Physical Exam Vitals: Blood pressure 100/60, weight 136 lb (61.7 kg), last menstrual period 03/29/2018.  General: NAD HEENT: normocephalic, anicteric Thyroid: no enlargement, no palpable nodules Pulmonary: No increased work of breathing, CTAB Cardiovascular: RRR, distal pulses 2+ Abdomen: NABS, soft, non-tender, non-distended.  Umbilicus without lesions.  No hepatomegaly, splenomegaly or masses palpable. No evidence of hernia   Genitourinary: deferred for no PAP/no concerns today Extremities: no edema, erythema, or tenderness Neurologic: Grossly intact Psychiatric: mood appropriate, affect full  Patient Name: Stephanie Navarro DOB: 10/29/1995 MRN: [id number] ULTRASOUND REPORT  Location: [facility] Date of Service: 08/19/2018   Indications:Anatomy Ultrasound Findings:  Mason Jim intrauterine pregnancy is visualized with FHR at 170 BPM. Biometrics give an (U/S) Gestational age of [redacted]w[redacted]d and an (U/S) EDD of 01/14/19; this does not correlate with the clinically established Estimated Date of Delivery: 01/03/19  Fetal presentation is Cephalic.  EFW: 260g (9oz). Placenta: posterior. Grade: 1. Cervix: 3.3cm AFI: subjectively normal.  Anatomic survey is complete; Gender - female. Dilated left renal pelvis.     Right Ovary is normal in appearance. Left Ovary is normal appearance. Survey of the adnexa demonstrates no  adnexal masses. There is no free peritoneal fluid in the cul de sac.  Impression: 1. [redacted]w[redacted]d Viable Singleton Intrauterine pregnancy by U/S. 2. (U/S) EDD is not consistent with Clinically established Estimated Date of Delivery: 01/03/19. [place] EDD of 01/14/19. 3. Dilated left renal pelvis, otherwise normal anatomy scan  Recommendations: 1.Clinical correlation with the patient's History and Physical Exam.  Darlina Guys, RDMS RVT   Assessment: 23 y.o. G2P1001 at [redacted]w[redacted]d by approximate LMP presenting to initiate prenatal care  Plan: 1) Avoid alcoholic beverages. 2) Patient encouraged not to smoke.  3) Discontinue the use of all non-medicinal drugs and chemicals.  4) Take prenatal vitamins daily.  5) Nutrition, food safety (fish, cheese advisories, and high nitrite foods) and exercise discussed. 6) Hospital and practice style discussed with cross coverage system.  7) Genetic Screening, such as with 1st Trimester Screening, cell free fetal DNA, AFP testing, and Ultrasound, as well as with  amniocentesis and CVS as appropriate, is discussed with patient. At the conclusion of today's visit patient declined genetic testing 8) Patient is asked about travel to areas at risk for the [place] virus, and counseled to avoid travel and exposure to mosquitoes or sexual partners who may have themselves been exposed to the virus. Testing is discussed, and will be ordered as appropriate. 9) UDS/CT test of cure today  10) Return at 2:30 today for anatomy scan/EDD adjusted for 11 day difference 11) Return to clinic in 4 weeks for ROB   Tresea Mall, CNM [facility], [facility] 08/19/2018, 10:35 AM

## 2018-08-20 LAB — CERVICOVAGINAL ANCILLARY ONLY
Chlamydia: NEGATIVE
Neisseria Gonorrhea: NEGATIVE
TRICH (WINDOWPATH): NEGATIVE

## 2018-08-22 LAB — URINE DRUG PANEL 7
Amphetamines, Urine: NEGATIVE ng/mL
Barbiturate Quant, Ur: NEGATIVE ng/mL
Benzodiazepine Quant, Ur: NEGATIVE ng/mL
Cannabinoid Quant, Ur: POSITIVE — AB
Cocaine (Metab.): NEGATIVE ng/mL
OPIATE QUANT UR: NEGATIVE ng/mL
PCP QUANT UR: NEGATIVE ng/mL

## 2018-08-24 ENCOUNTER — Encounter: Payer: Self-pay | Admitting: Obstetrics & Gynecology

## 2018-08-24 ENCOUNTER — Ambulatory Visit (INDEPENDENT_AMBULATORY_CARE_PROVIDER_SITE_OTHER): Payer: Medicaid Other | Admitting: Obstetrics & Gynecology

## 2018-08-24 ENCOUNTER — Other Ambulatory Visit: Payer: Self-pay

## 2018-08-24 VITALS — BP 100/60 | Wt 131.0 lb

## 2018-08-24 DIAGNOSIS — Z3A19 19 weeks gestation of pregnancy: Secondary | ICD-10-CM

## 2018-08-24 DIAGNOSIS — O4692 Antepartum hemorrhage, unspecified, second trimester: Secondary | ICD-10-CM

## 2018-08-24 LAB — POCT URINALYSIS DIPSTICK OB
Glucose, UA: NEGATIVE
PROTEIN: NEGATIVE

## 2018-08-24 NOTE — Addendum Note (Signed)
Addended by: Cornelius Moras D on: 08/24/2018 11:00 AM   Modules accepted: Orders

## 2018-08-24 NOTE — Progress Notes (Signed)
  Obstetric Problem Visit    Chief Complaint  Patient presents with  . Vaginal Bleeding   History of Present Illness: Patient is a 23 y.o. G2P1001 [redacted]w[redacted]d presenting for SECOND trimester bleeding.  The onset of bleeding was 2 days ago (light, not associated with pain or other sx's) and again this am as well.  No recent trauma, infection, travel, or sex.  Is bleeding equal to or greater than normal menstrual flow:  No Any recent trauma:  No Recent intercourse:  No History of prior miscarriage:  No Prior ultrasound demonstrating IUP:  Yes Prior ultrasound demonstrating viable IUP:  Yes Prior Serum HCG:  Yes Rh status: O POS  PMHx: She  has no past medical history on file. Also,  has a past surgical history that includes laparoscopic appendectomy (N/A, 05/21/2017) and Appendectomy (05/21/2017)., family history is not on file.,  reports that she has been smoking. She has never used smokeless tobacco. She reports that she does not drink alcohol or use drugs.  She has a current medication list which includes the following prescription(s): pnv prenatal plus multivitamin. Also, has No Known Allergies.  Review of Systems  All other systems reviewed and are negative.   Objective: Vitals:   08/24/18 1035  BP: 100/60   Physical Exam Constitutional:      General: She is not in acute distress.    Appearance: She is well-developed.  Genitourinary:     Pelvic exam was performed with patient supine.     Vagina and uterus normal.     No vaginal erythema or bleeding.     No cervical motion tenderness, discharge, polyp or nabothian cyst.     Uterus is mobile.     Uterus is not enlarged.     No uterine mass detected.    Uterus is midaxial.     No right or left adnexal mass present.     Right adnexa not tender.     Left adnexa not tender.     Genitourinary Comments: Cervix closed, thick, high  HENT:     Head: Normocephalic and atraumatic.     Nose: Nose normal.  Abdominal:     General:  There is no distension.     Palpations: Abdomen is soft.     Tenderness: There is no abdominal tenderness.  Musculoskeletal: Normal range of motion.  Neurological:     Mental Status: She is alert and oriented to person, place, and time.     Cranial Nerves: No cranial nerve deficit.  Skin:    General: Skin is warm and dry.   FHT 150s  Assessment: 23 y.o. G2P1001 [redacted]w[redacted]d  Problem List Items Addressed This Visit      Other   Antepartum bleeding, second trimester - Primary      1) Second trimester bleeding - incidence and clinical course of second trimester bleeding is discussed in detail with the patient today.  Likely cervical, especially as exam is normal w reassuring FHTs today and no thinning or dilation of cervix.  2) The patient is Rh POS, rhogam is therefore not indicated to decrease the risk rhesus alloimmunization.    3) Routine bleeding precautions were discussed with the patient prior the conclusion of today's visit.  Annamarie Major, MD, [facility], [facility] 08/24/2018  10:54 AM

## 2018-09-16 ENCOUNTER — Encounter: Payer: Medicaid Other | Admitting: Maternal Newborn

## 2018-10-08 ENCOUNTER — Encounter: Payer: Medicaid Other | Admitting: Maternal Newborn

## 2018-11-11 ENCOUNTER — Other Ambulatory Visit: Payer: Self-pay

## 2018-11-11 ENCOUNTER — Ambulatory Visit (INDEPENDENT_AMBULATORY_CARE_PROVIDER_SITE_OTHER): Payer: Medicaid Other | Admitting: Obstetrics and Gynecology

## 2018-11-11 ENCOUNTER — Encounter: Payer: Self-pay | Admitting: Obstetrics and Gynecology

## 2018-11-11 VITALS — BP 110/50 | Wt 158.0 lb

## 2018-11-11 DIAGNOSIS — Z23 Encounter for immunization: Secondary | ICD-10-CM | POA: Diagnosis not present

## 2018-11-11 DIAGNOSIS — O35EXX0 Maternal care for other (suspected) fetal abnormality and damage, fetal genitourinary anomalies, not applicable or unspecified: Secondary | ICD-10-CM

## 2018-11-11 DIAGNOSIS — Z3480 Encounter for supervision of other normal pregnancy, unspecified trimester: Secondary | ICD-10-CM

## 2018-11-11 DIAGNOSIS — O358XX0 Maternal care for other (suspected) fetal abnormality and damage, not applicable or unspecified: Secondary | ICD-10-CM

## 2018-11-11 DIAGNOSIS — Z3A30 30 weeks gestation of pregnancy: Secondary | ICD-10-CM

## 2018-11-11 NOTE — Progress Notes (Signed)
ROB No concerns  Tdap/BT consent forms signed

## 2018-11-11 NOTE — Progress Notes (Signed)
    Routine Prenatal Care Visit  Subjective  Stephanie Navarro is a 23 y.o. G2P1001 at [redacted]w[redacted]d being seen today for ongoing prenatal care.  She is currently monitored for the following issues for this low-risk pregnancy and has Acute appendicitis with localized peritonitis; Encounter for procreative genetic counseling; Supervision of other normal pregnancy, antepartum; and Antepartum bleeding, second trimester on their problem list.  ----------------------------------------------------------------------------------- Patient reports no complaints.   Contractions: Not present. Vag. Bleeding: None.  Movement: Present. Denies leaking of fluid.  ----------------------------------------------------------------------------------- The following portions of the patient's history were reviewed and updated as appropriate: allergies, current medications, past family history, past medical history, past social history, past surgical history and problem list. Problem list updated.   Objective  Blood pressure (!) 110/50, weight 158 lb (71.7 kg), last menstrual period 03/29/2018. Pregravid weight 126 lb (57.2 kg) Total Weight Gain 32 lb (14.5 kg) Urinalysis:      Fetal Status: Fetal Heart Rate (bpm): 145 Fundal Height: 30 cm Movement: Present     General:  Alert, oriented and cooperative. Patient is in no acute distress.  Skin: Skin is warm and dry. No rash noted.   Cardiovascular: Normal heart rate noted  Respiratory: Normal respiratory effort, no problems with respiration noted  Abdomen: Soft, gravid, appropriate for gestational age. Pain/Pressure: Absent     Pelvic:  Cervical exam deferred        Extremities: Normal range of motion.     Mental Status: Normal mood and affect. Normal behavior. Normal judgment and thought content.     Assessment   24 y.o. G2P1001 at [redacted]w[redacted]d by  01/14/2019, by Ultrasound presenting for routine prenatal visit  Plan   pregnancy 2 Problems (from 03/29/18 to present)    Problem Noted Resolved   Supervision of other normal pregnancy, antepartum 08/19/2018 by Tresea Mall, CNM No   Overview Addendum 11/11/2018  9:39 PM by Natale Milch, MD    Clinic [facility] Prenatal Labs  Dating  LMP = 18 wk [place] Blood type:   O positive  Genetic Screen None Antibody:  negative  Anatomic [place] Complete [ ]  pyelectasis, follow up  Rubella:   IMMUNE Varicella: Varivax x2  GTT   Third trimester:  RPR:   nonreactive  Rhogam  not needed HBsAg:   negative  TDaP vaccine  Flu Shot: declined 12/27 HIV:   negative  Baby Food   Bottle                             GBS:   Contraception Nexplanon Pap: needs postpartum  CBB     CS/VBAC NA   Support Person Cousin Katlyn            Needs test of cure for chlamydia at next visit Declines 1 GTT today and 28 weeks labs, will need next visit Contacted Care management regarding patient and her need for ride assistance to visits Needs [place] to follow up fetal kidney Tdap today Discussed bottle vs breast and postpartum birth control  Gestational age appropriate obstetric precautions including but not limited to vaginal bleeding, contractions, leaking of fluid and fetal movement were reviewed in detail with the patient.    Return in about 1 week (around 11/18/2018) for 1GTT testing and [place] and ROB in person.  Natale Milch MD [facility], [facility] 11/11/2018, 9:40 PM

## 2018-11-12 ENCOUNTER — Telehealth: Payer: Self-pay

## 2018-11-12 NOTE — Telephone Encounter (Signed)
Gayland Curry, case manager c [facility], calling.  States she has tried to get in touch c pt re getting her in for appt and providing tx.  The number she has is not current or disconnected.  Calling to see if we have a different #.  [phone]  Adv Angie of pt's current # of [phone].

## 2018-11-20 ENCOUNTER — Other Ambulatory Visit: Payer: Medicaid Other

## 2018-11-20 ENCOUNTER — Ambulatory Visit (INDEPENDENT_AMBULATORY_CARE_PROVIDER_SITE_OTHER): Payer: Medicaid Other

## 2018-11-20 ENCOUNTER — Ambulatory Visit (INDEPENDENT_AMBULATORY_CARE_PROVIDER_SITE_OTHER): Payer: Medicaid Other | Admitting: Obstetrics and Gynecology

## 2018-11-20 ENCOUNTER — Other Ambulatory Visit: Payer: Self-pay

## 2018-11-20 VITALS — BP 98/64 | Wt 154.0 lb

## 2018-11-20 DIAGNOSIS — Z3480 Encounter for supervision of other normal pregnancy, unspecified trimester: Secondary | ICD-10-CM

## 2018-11-20 DIAGNOSIS — O358XX0 Maternal care for other (suspected) fetal abnormality and damage, not applicable or unspecified: Secondary | ICD-10-CM

## 2018-11-20 DIAGNOSIS — Z3A32 32 weeks gestation of pregnancy: Secondary | ICD-10-CM

## 2018-11-20 DIAGNOSIS — O35EXX0 Maternal care for other (suspected) fetal abnormality and damage, fetal genitourinary anomalies, not applicable or unspecified: Secondary | ICD-10-CM

## 2018-11-20 DIAGNOSIS — Z113 Encounter for screening for infections with a predominantly sexual mode of transmission: Secondary | ICD-10-CM

## 2018-11-20 DIAGNOSIS — Z3483 Encounter for supervision of other normal pregnancy, third trimester: Secondary | ICD-10-CM

## 2018-11-20 LAB — OB RESULTS CONSOLE HEPATITIS B SURFACE ANTIGEN: Hepatitis B Surface Ag: NEGATIVE

## 2018-11-20 NOTE — Progress Notes (Signed)
ROB Growth scan 1hr GTT

## 2018-11-20 NOTE — Progress Notes (Signed)
ROB 1hr GTT Ultrasound

## 2018-11-20 NOTE — Progress Notes (Signed)
    Routine Prenatal Care Visit  Subjective  Stephanie Navarro is a 23 y.o. G2P1001 at [redacted]w[redacted]d being seen today for ongoing prenatal care.  She is currently monitored for the following issues for this low-risk pregnancy and has Acute appendicitis with localized peritonitis; Encounter for procreative genetic counseling; Supervision of other normal pregnancy, antepartum; and Antepartum bleeding, second trimester on their problem list.  ----------------------------------------------------------------------------------- Patient reports no complaints.   Contractions: Not present. Vag. Bleeding: None.  Movement: Present. Denies leaking of fluid.  ----------------------------------------------------------------------------------- The following portions of the patient's history were reviewed and updated as appropriate: allergies, current medications, past family history, past medical history, past social history, past surgical history and problem list. Problem list updated.   Objective  Blood pressure 98/64, weight 154 lb (69.9 kg), last menstrual period 03/29/2018. Pregravid weight 126 lb (57.2 kg) Total Weight Gain 28 lb (12.7 kg) Urinalysis:      Fetal Status: Fetal Heart Rate (bpm): 145 Fundal Height: 31 cm Movement: Present     General:  Alert, oriented and cooperative. Patient is in no acute distress.  Skin: Skin is warm and dry. No rash noted.   Cardiovascular: Normal heart rate noted  Respiratory: Normal respiratory effort, no problems with respiration noted  Abdomen: Soft, gravid, appropriate for gestational age. Pain/Pressure: Absent     Pelvic:  Cervical exam deferred        Extremities: Normal range of motion.     ental Status: Normal mood and affect. Normal behavior. Normal judgment and thought content.     Assessment   23 y.o. G2P1001 at [redacted]w[redacted]d by  01/14/2019, by Ultrasound presenting for routine prenatal visit  Plan   pregnancy 2 Problems (from 03/29/18 to present)    Problem  Noted Resolved   Supervision of other normal pregnancy, antepartum 08/19/2018 by Rod Can, CNM No   Overview Addendum 11/11/2018  9:39 PM by Homero Fellers, MD    [facility] Prenatal Labs  Dating  LMP = 18 wk [place] Blood type:   O positive  Genetic Screen None Antibody:  negative  Anatomic [place] Complete [ ]  pyelectasis, follow up  Rubella:   IMMUNE Varicella: Varivax x2  GTT   Third trimester:  RPR:   nonreactive  Rhogam  not needed HBsAg:   negative  TDaP vaccine  Flu Shot: declined 12/27 HIV:   negative  Baby Food   Bottle                             GBS:   Contraception Nexplanon Pap: needs postpartum  CBB     CS/VBAC NA   Support Person Lonia Farber              Gestational age appropriate obstetric precautions including but not limited to vaginal bleeding, contractions, leaking of fluid and fetal movement were reviewed in detail with the patient.    - chlamydia TOC today - 28 week labs  Return in about 2 weeks (around 12/04/2018) for ROB.  Malachy Mood, MD, [facility], [facility] 11/20/2018, 9:34 AM

## 2018-11-21 LAB — 28 WEEK RH+PANEL
Basophils Absolute: 0 10*3/uL (ref 0.0–0.2)
Basos: 0 %
EOS (ABSOLUTE): 0.1 10*3/uL (ref 0.0–0.4)
Eos: 1 %
Gestational Diabetes Screen: 104 mg/dL (ref 65–139)
HIV Screen 4th Generation wRfx: NONREACTIVE
Hematocrit: 33.6 % — ABNORMAL LOW (ref 34.0–46.6)
Hemoglobin: 11.3 g/dL (ref 11.1–15.9)
Immature Grans (Abs): 0 10*3/uL (ref 0.0–0.1)
Immature Granulocytes: 0 %
Lymphocytes Absolute: 1.1 10*3/uL (ref 0.7–3.1)
Lymphs: 11 %
MCH: 30.7 pg (ref 26.6–33.0)
MCHC: 33.6 g/dL (ref 31.5–35.7)
MCV: 91 fL (ref 79–97)
Monocytes Absolute: 0.4 10*3/uL (ref 0.1–0.9)
Monocytes: 4 %
Neutrophils Absolute: 8.5 10*3/uL — ABNORMAL HIGH (ref 1.4–7.0)
Neutrophils: 84 %
Platelets: 169 10*3/uL (ref 150–450)
RBC: 3.68 x10E6/uL — ABNORMAL LOW (ref 3.77–5.28)
RDW: 12.1 % (ref 11.7–15.4)
RPR Ser Ql: NONREACTIVE
WBC: 10.2 10*3/uL (ref 3.4–10.8)

## 2018-11-25 NOTE — Progress Notes (Signed)
Negative, Released to mychart 

## 2018-11-26 LAB — GC/CHLAMYDIA PROBE AMP
Chlamydia trachomatis, NAA: NEGATIVE
Neisseria Gonorrhoeae by PCR: NEGATIVE

## 2018-12-04 ENCOUNTER — Encounter: Payer: Medicaid Other | Admitting: Obstetrics and Gynecology

## 2018-12-18 ENCOUNTER — Other Ambulatory Visit: Payer: Self-pay

## 2018-12-18 ENCOUNTER — Ambulatory Visit (INDEPENDENT_AMBULATORY_CARE_PROVIDER_SITE_OTHER): Payer: Medicaid Other | Admitting: Obstetrics and Gynecology

## 2018-12-18 ENCOUNTER — Other Ambulatory Visit (HOSPITAL_COMMUNITY)
Admission: RE | Admit: 2018-12-18 | Discharge: 2018-12-18 | Disposition: A | Discharge status: Home / Self Care | Payer: Medicaid Other | Source: Ambulatory Visit | Attending: Obstetrics and Gynecology | Admitting: Obstetrics and Gynecology

## 2018-12-18 ENCOUNTER — Encounter: Payer: Self-pay | Admitting: Obstetrics and Gynecology

## 2018-12-18 VITALS — BP 100/50 | Wt 153.0 lb

## 2018-12-18 DIAGNOSIS — Z3A36 36 weeks gestation of pregnancy: Secondary | ICD-10-CM

## 2018-12-18 DIAGNOSIS — Z3483 Encounter for supervision of other normal pregnancy, third trimester: Secondary | ICD-10-CM

## 2018-12-18 DIAGNOSIS — Z3480 Encounter for supervision of other normal pregnancy, unspecified trimester: Secondary | ICD-10-CM

## 2018-12-18 LAB — OB RESULTS CONSOLE GC/CHLAMYDIA: Gonorrhea: NEGATIVE

## 2018-12-18 NOTE — Progress Notes (Signed)
ROB GBS/Aptima  C/o pelvic bone pain, and lower back pain  Denies lof

## 2018-12-21 LAB — CERVICOVAGINAL ANCILLARY ONLY
Chlamydia: NEGATIVE
Neisseria Gonorrhea: NEGATIVE

## 2018-12-21 NOTE — Progress Notes (Signed)
    Routine Prenatal Care Visit  Subjective  Stephanie Navarro is a 23 y.o. G2P1001 at [redacted]w[redacted]d being seen today for ongoing prenatal care.  She is currently monitored for the following issues for this low-risk pregnancy and has Acute appendicitis with localized peritonitis; Encounter for procreative genetic counseling; Supervision of other normal pregnancy, antepartum; and Antepartum bleeding, second trimester on their problem list.  ----------------------------------------------------------------------------------- Patient reports no complaints.   Contractions: Not present. Vag. Bleeding: None.  Movement: Present. Denies leaking of fluid.  ----------------------------------------------------------------------------------- The following portions of the patient's history were reviewed and updated as appropriate: allergies, current medications, past family history, past medical history, past social history, past surgical history and problem list. Problem list updated.   Objective  Blood pressure (!) 100/50, weight 153 lb (69.4 kg), last menstrual period 03/29/2018. Pregravid weight 126 lb (57.2 kg) Total Weight Gain 27 lb (12.2 kg) Urinalysis:      Fetal Status: Fetal Heart Rate (bpm): 140 Fundal Height: 36 cm Movement: Present     General:  Alert, oriented and cooperative. Patient is in no acute distress.  Skin: Skin is warm and dry. No rash noted.   Cardiovascular: Normal heart rate noted  Respiratory: Normal respiratory effort, no problems with respiration noted  Abdomen: Soft, gravid, appropriate for gestational age. Pain/Pressure: Present     Pelvic:  Cervical exam deferred        Extremities: Normal range of motion.     Mental Status: Normal mood and affect. Normal behavior. Normal judgment and thought content.     Assessment   23 y.o. G2P1001 at [redacted]w[redacted]d by  01/14/2019, by Ultrasound presenting for routine prenatal visit  Plan   pregnancy 2 Problems (from 03/29/18 to present)    Problem Noted Resolved   Supervision of other normal pregnancy, antepartum 08/19/2018 by Rod Can, CNM No   Overview Addendum 12/21/2018 10:45 AM by Homero Fellers, MD    [facility] Prenatal Labs  Dating  LMP = 18 wk [place] Blood type:   O positive  Genetic Screen None Antibody:  negative  Anatomic [place] Complete [ x] pyelectasis, follow up after birth Rubella:   IMMUNE Varicella: Varivax x2  GTT   Third trimester: 104 RPR:   nonreactive  Rhogam  not needed HBsAg:   negative  TDaP vaccine  Flu Shot: declined 12/27 HIV:   negative  Baby Food   Bottle                             GBS:   Contraception Nexplanon Pap: needs postpartum  CBB     CS/VBAC NA   Support Person Lonia Farber              Gestational age appropriate obstetric precautions including but not limited to vaginal bleeding, contractions, leaking of fluid and fetal movement were reviewed in detail with the patient.    GBS and gc/ct today  Return in about 1 week (around 12/25/2018) for ROB in person.  Homero Fellers MD [facility], [facility] 12/21/2018, 10:46 AM

## 2018-12-22 LAB — CULTURE, BETA STREP (GROUP B ONLY): Strep Gp B Culture: NEGATIVE

## 2018-12-22 LAB — OB RESULTS CONSOLE GBS: GBS: NEGATIVE

## 2018-12-25 ENCOUNTER — Encounter: Payer: Self-pay | Admitting: Maternal Newborn

## 2018-12-25 ENCOUNTER — Ambulatory Visit (INDEPENDENT_AMBULATORY_CARE_PROVIDER_SITE_OTHER): Payer: Medicaid Other | Admitting: Maternal Newborn

## 2018-12-25 ENCOUNTER — Other Ambulatory Visit: Payer: Self-pay

## 2018-12-25 VITALS — BP 114/70 | Wt 156.0 lb

## 2018-12-25 DIAGNOSIS — Z3480 Encounter for supervision of other normal pregnancy, unspecified trimester: Secondary | ICD-10-CM

## 2018-12-25 DIAGNOSIS — Z3483 Encounter for supervision of other normal pregnancy, third trimester: Secondary | ICD-10-CM

## 2018-12-25 DIAGNOSIS — Z3A37 37 weeks gestation of pregnancy: Secondary | ICD-10-CM

## 2018-12-25 LAB — POCT URINALYSIS DIPSTICK OB
Glucose, UA: NEGATIVE
POC,PROTEIN,UA: NEGATIVE

## 2018-12-25 NOTE — Progress Notes (Signed)
WNL

## 2018-12-25 NOTE — Progress Notes (Signed)
No vb. No lof.  

## 2018-12-25 NOTE — Progress Notes (Signed)
    Routine Prenatal Care Visit  Subjective  Stephanie Navarro is a 23 y.o. G2P1001 at [redacted]w[redacted]d being seen today for ongoing prenatal care.  She is currently monitored for the following issues for this low-risk pregnancy and has Supervision of other normal pregnancy, antepartum on their problem list.  ----------------------------------------------------------------------------------- Patient reports round ligament and hip pain.   Contractions: Not present. Vag. Bleeding: None.  Movement: Present. No leaking of fluid.  ----------------------------------------------------------------------------------- The following portions of the patient's history were reviewed and updated as appropriate: allergies, current medications, past family history, past medical history, past social history, past surgical history and problem list. Problem list updated.   Objective  Blood pressure 114/70, weight 156 lb (70.8 kg), last menstrual period 03/29/2018. Pregravid weight 126 lb (57.2 kg) Total Weight Gain 30 lb (13.6 kg)  Urinalysis: Urine dipstick shows negative for glucose, protein.  Fetal Status: Fetal Heart Rate (bpm): 137 Fundal Height: 37 cm Movement: Present     General:  Alert, oriented and cooperative. Patient is in no acute distress.  Skin: Skin is warm and dry. No rash noted.   Cardiovascular: Normal heart rate noted  Respiratory: Normal respiratory effort, no problems with respiration noted  Abdomen: Soft, gravid, appropriate for gestational age. Pain/Pressure: Present     Pelvic:  Cervical exam deferred        Extremities: Normal range of motion.  Edema: None  Mental Status: Normal mood and affect. Normal behavior. Normal judgment and thought content.     Assessment   23 y.o. G2P1001 at [redacted]w[redacted]d, EDD 01/14/2019 by Ultrasound presenting for a routine prenatal visit.  Plan   pregnancy 2 Problems (from 03/29/18 to present)    Problem Noted Resolved   Supervision of other normal pregnancy,  antepartum 08/19/2018 by Rod Can, CNM No   Overview Addendum 12/21/2018 10:45 AM by Homero Fellers, MD    [facility] Prenatal Labs  Dating  LMP = 18 wk [place] Blood type:   O positive  Genetic Screen None Antibody:  negative  Anatomic [place] Complete [ x] pyelectasis, follow up after birth Rubella:   IMMUNE Varicella: Varivax x2  GTT   Third trimester: 104 RPR:   nonreactive  Rhogam  not needed HBsAg:   negative  TDaP vaccine  Flu Shot: declined 12/27 HIV:   negative  Baby Food   Bottle                             GBS:   Contraception Nexplanon Pap: needs postpartum  CBB     CS/VBAC NA   Support Person Cousin Katlyn              Term labor symptoms and general obstetric precautions including but not limited to vaginal bleeding, contractions, leaking of fluid and fetal movement were reviewed.  Please refer to After Visit Summary for other counseling recommendations.   Return in about 1 week (around 01/01/2019) for ROB.  Avel Sensor, CNM 12/25/2018  11:50 AM

## 2018-12-25 NOTE — Patient Instructions (Signed)
Third Trimester of Pregnancy The third trimester is from week 28 through week 40 (months 7 through 9). The third trimester is a time when the unborn baby (fetus) is growing rapidly. At the end of the ninth month, the fetus is about 20 inches in length and weighs 6-10 pounds. Body changes during your third trimester Your body will continue to go through many changes during pregnancy. The changes vary from woman to woman. During the third trimester:  Your weight will continue to increase. You can expect to gain 25-35 pounds (11-16 kg) by the end of the pregnancy.  You may begin to get stretch marks on your hips, abdomen, and breasts.  You may urinate more often because the fetus is moving lower into your pelvis and pressing on your bladder.  You may develop or continue to have heartburn. This is caused by increased hormones that slow down muscles in the digestive tract.  You may develop or continue to have constipation because increased hormones slow digestion and cause the muscles that push waste through your intestines to relax.  You may develop hemorrhoids. These are swollen veins (varicose veins) in the rectum that can itch or be painful.  You may develop swollen, bulging veins (varicose veins) in your legs.  You may have increased body aches in the pelvis, back, or thighs. This is due to weight gain and increased hormones that are relaxing your joints.  You may have changes in your hair. These can include thickening of your hair, rapid growth, and changes in texture. Some women also have hair loss during or after pregnancy, or hair that feels dry or thin. Your hair will most likely return to normal after your baby is born.  Your breasts will continue to grow and they will continue to become tender. A yellow fluid (colostrum) may leak from your breasts. This is the first milk you are producing for your baby.  Your belly button may stick out.  You may notice more swelling in your hands,  face, or ankles.  You may have increased tingling or numbness in your hands, arms, and legs. The skin on your belly may also feel numb.  You may feel short of breath because of your expanding uterus.  You may have more problems sleeping. This can be caused by the size of your belly, increased need to urinate, and an increase in your body's metabolism.  You may notice the fetus "dropping," or moving lower in your abdomen (lightening).  You may have increased vaginal discharge.  You may notice your joints feel loose and you may have pain around your pelvic bone. What to expect at prenatal visits You will have prenatal exams every 2 weeks until week 36. Then you will have weekly prenatal exams. During a routine prenatal visit:  You will be weighed to make sure you and the baby are growing normally.  Your blood pressure will be taken.  Your abdomen will be measured to track your baby's growth.  The fetal heartbeat will be listened to.  Any test results from the previous visit will be discussed.  You may have a cervical check near your due date to see if your cervix has softened or thinned (effaced).  You will be tested for Group B streptococcus. This happens between 35 and 37 weeks. Your health care provider may ask you:  What your birth plan is.  How you are feeling.  If you are feeling the baby move.  If you have had any abnormal   symptoms, such as leaking fluid, bleeding, severe headaches, or abdominal cramping.  If you are using any tobacco products, including cigarettes, chewing tobacco, and electronic cigarettes.  If you have any questions. Other tests or screenings that may be performed during your third trimester include:  Blood tests that check for low iron levels (anemia).  Fetal testing to check the health, activity level, and growth of the fetus. Testing is done if you have certain medical conditions or if there are problems during the pregnancy.  Nonstress test  (NST). This test checks the health of your baby to make sure there are no signs of problems, such as the baby not getting enough oxygen. During this test, a belt is placed around your belly. The baby is made to move, and its heart rate is monitored during movement. What is false labor? False labor is a condition in which you feel small, irregular tightenings of the muscles in the womb (contractions) that usually go away with rest, changing position, or drinking water. These are called Braxton Hicks contractions. Contractions may last for hours, days, or even weeks before true labor sets in. If contractions come at regular intervals, become more frequent, increase in intensity, or become painful, you should see your health care provider. What are the signs of labor?  Abdominal cramps.  Regular contractions that start at 10 minutes apart and become stronger and more frequent with time.  Contractions that start on the top of the uterus and spread down to the lower abdomen and back.  Increased pelvic pressure and dull back pain.  A watery or bloody mucus discharge that comes from the vagina.  Leaking of amniotic fluid. This is also known as your "water breaking." It could be a slow trickle or a gush. Let your health care provider know if it has a color or strange odor. If you have any of these signs, call your health care provider right away, even if it is before your due date. Follow these instructions at home: Medicines  Follow your health care provider's instructions regarding medicine use. Specific medicines may be either safe or unsafe to take during pregnancy.  Take a prenatal vitamin that contains at least 600 micrograms (mcg) of folic acid.  If you develop constipation, try taking a stool softener if your health care provider approves. Eating and drinking   Eat a balanced diet that includes fresh fruits and vegetables, whole grains, good sources of protein such as meat, eggs, or tofu,  and low-fat dairy. Your health care provider will help you determine the amount of weight gain that is right for you.  Avoid raw meat and uncooked cheese. These carry germs that can cause birth defects in the baby.  If you have low calcium intake from food, talk to your health care provider about whether you should take a daily calcium supplement.  Eat four or five small meals rather than three large meals a day.  Limit foods that are high in fat and processed sugars, such as fried and sweet foods.  To prevent constipation: ? Drink enough fluid to keep your urine clear or pale yellow. ? Eat foods that are high in fiber, such as fresh fruits and vegetables, whole grains, and beans. Activity  Exercise only as directed by your health care provider. Most women can continue their usual exercise routine during pregnancy. Try to exercise for 30 minutes at least 5 days a week. Stop exercising if you experience uterine contractions.  Avoid heavy lifting.  Do   not exercise in extreme heat or humidity, or at high altitudes.  Wear low-heel, comfortable shoes.  Practice good posture.  You may continue to have sex unless your health care provider tells you otherwise. Relieving pain and discomfort  Take frequent breaks and rest with your legs elevated if you have leg cramps or low back pain.  Take warm sitz baths to soothe any pain or discomfort caused by hemorrhoids. Use hemorrhoid cream if your health care provider approves.  Wear a good support bra to prevent discomfort from breast tenderness.  If you develop varicose veins: ? Wear support pantyhose or compression stockings as told by your healthcare provider. ? Elevate your feet for 15 minutes, 3-4 times a day. Prenatal care  Write down your questions. Take them to your prenatal visits.  Keep all your prenatal visits as told by your health care provider. This is important. Safety  Wear your seat belt at all times when driving.  Make  a list of emergency phone numbers, including numbers for family, friends, the hospital, and police and fire departments. General instructions  Avoid cat litter boxes and soil used by cats. These carry germs that can cause birth defects in the baby. If you have a cat, ask someone to clean the litter box for you.  Do not travel far distances unless it is absolutely necessary and only with the approval of your health care provider.  Do not use hot tubs, steam rooms, or saunas.  Do not drink alcohol.  Do not use any products that contain nicotine or tobacco, such as cigarettes and e-cigarettes. If you need help quitting, ask your health care provider.  Do not use any medicinal herbs or unprescribed drugs. These chemicals affect the formation and growth of the baby.  Do not douche or use tampons or scented sanitary pads.  Do not cross your legs for long periods of time.  To prepare for the arrival of your baby: ? Take prenatal classes to understand, practice, and ask questions about labor and delivery. ? Make a trial run to the hospital. ? Visit the hospital and tour the maternity area. ? Arrange for maternity or paternity leave through employers. ? Arrange for family and friends to take care of pets while you are in the hospital. ? Purchase a rear-facing car seat and make sure you know how to install it in your car. ? Pack your hospital bag. ? Prepare the baby's nursery. Make sure to remove all pillows and stuffed animals from the baby's crib to prevent suffocation.  Visit your dentist if you have not gone during your pregnancy. Use a soft toothbrush to brush your teeth and be gentle when you floss. Contact a health care provider if:  You are unsure if you are in labor or if your water has broken.  You become dizzy.  You have mild pelvic cramps, pelvic pressure, or nagging pain in your abdominal area.  You have lower back pain.  You have persistent nausea, vomiting, or diarrhea.   You have an unusual or bad smelling vaginal discharge.  You have pain when you urinate. Get help right away if:  Your water breaks before 37 weeks.  You have regular contractions less than 5 minutes apart before 37 weeks.  You have a fever.  You are leaking fluid from your vagina.  You have spotting or bleeding from your vagina.  You have severe abdominal pain or cramping.  You have rapid weight loss or weight gain.  You have   shortness of breath with chest pain.  You notice sudden or extreme swelling of your face, hands, ankles, feet, or legs.  Your baby makes fewer than 10 movements in 2 hours.  You have severe headaches that do not go away when you take medicine.  You have vision changes. Summary  The third trimester is from week 28 through week 40, months 7 through 9. The third trimester is a time when the unborn baby (fetus) is growing rapidly.  During the third trimester, your discomfort may increase as you and your baby continue to gain weight. You may have abdominal, leg, and back pain, sleeping problems, and an increased need to urinate.  During the third trimester your breasts will keep growing and they will continue to become tender. A yellow fluid (colostrum) may leak from your breasts. This is the first milk you are producing for your baby.  False labor is a condition in which you feel small, irregular tightenings of the muscles in the womb (contractions) that eventually go away. These are called Braxton Hicks contractions. Contractions may last for hours, days, or even weeks before true labor sets in.  Signs of labor can include: abdominal cramps; regular contractions that start at 10 minutes apart and become stronger and more frequent with time; watery or bloody mucus discharge that comes from the vagina; increased pelvic pressure and dull back pain; and leaking of amniotic fluid. This information is not intended to replace advice given to you by your health  care provider. Make sure you discuss any questions you have with your health care provider. Document Released: 05/21/2001 Document Revised: 09/17/2018 Document Reviewed: 07/02/2016 Elsevier Patient Education  2020 [employer]  

## 2018-12-31 IMAGING — CR DG CHEST 2V
1 series · 2 of 2 positions shown · non-contrast
Comparison: None.

CLINICAL DATA: Acute onset of right-sided rib pain.  Nausea.

EXAM:
CHEST  2 VIEW

[Series 1: dg chest 2 view · 0.14mm/px · 2 of 2 slices shown]
[im 1/2]
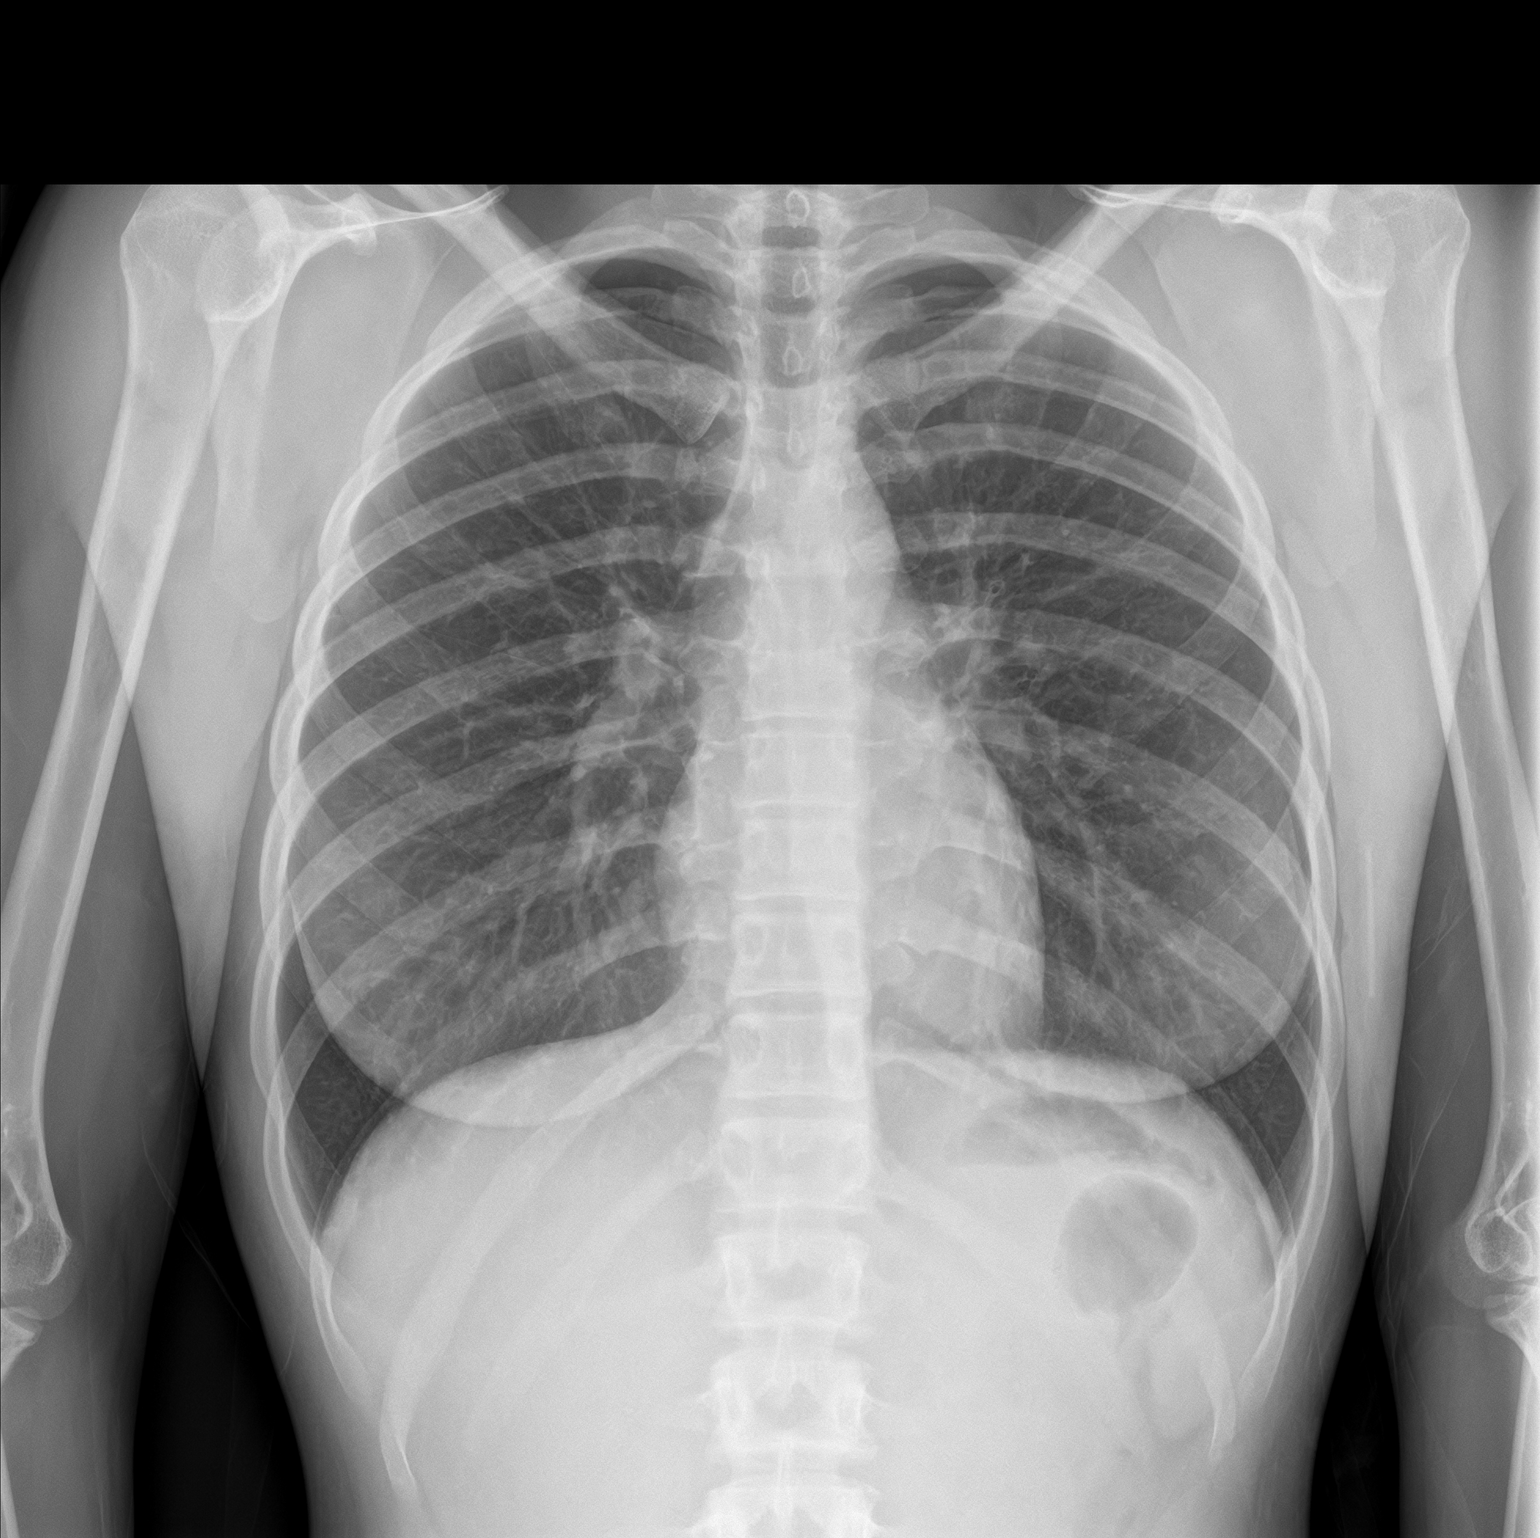
[im 2/2]
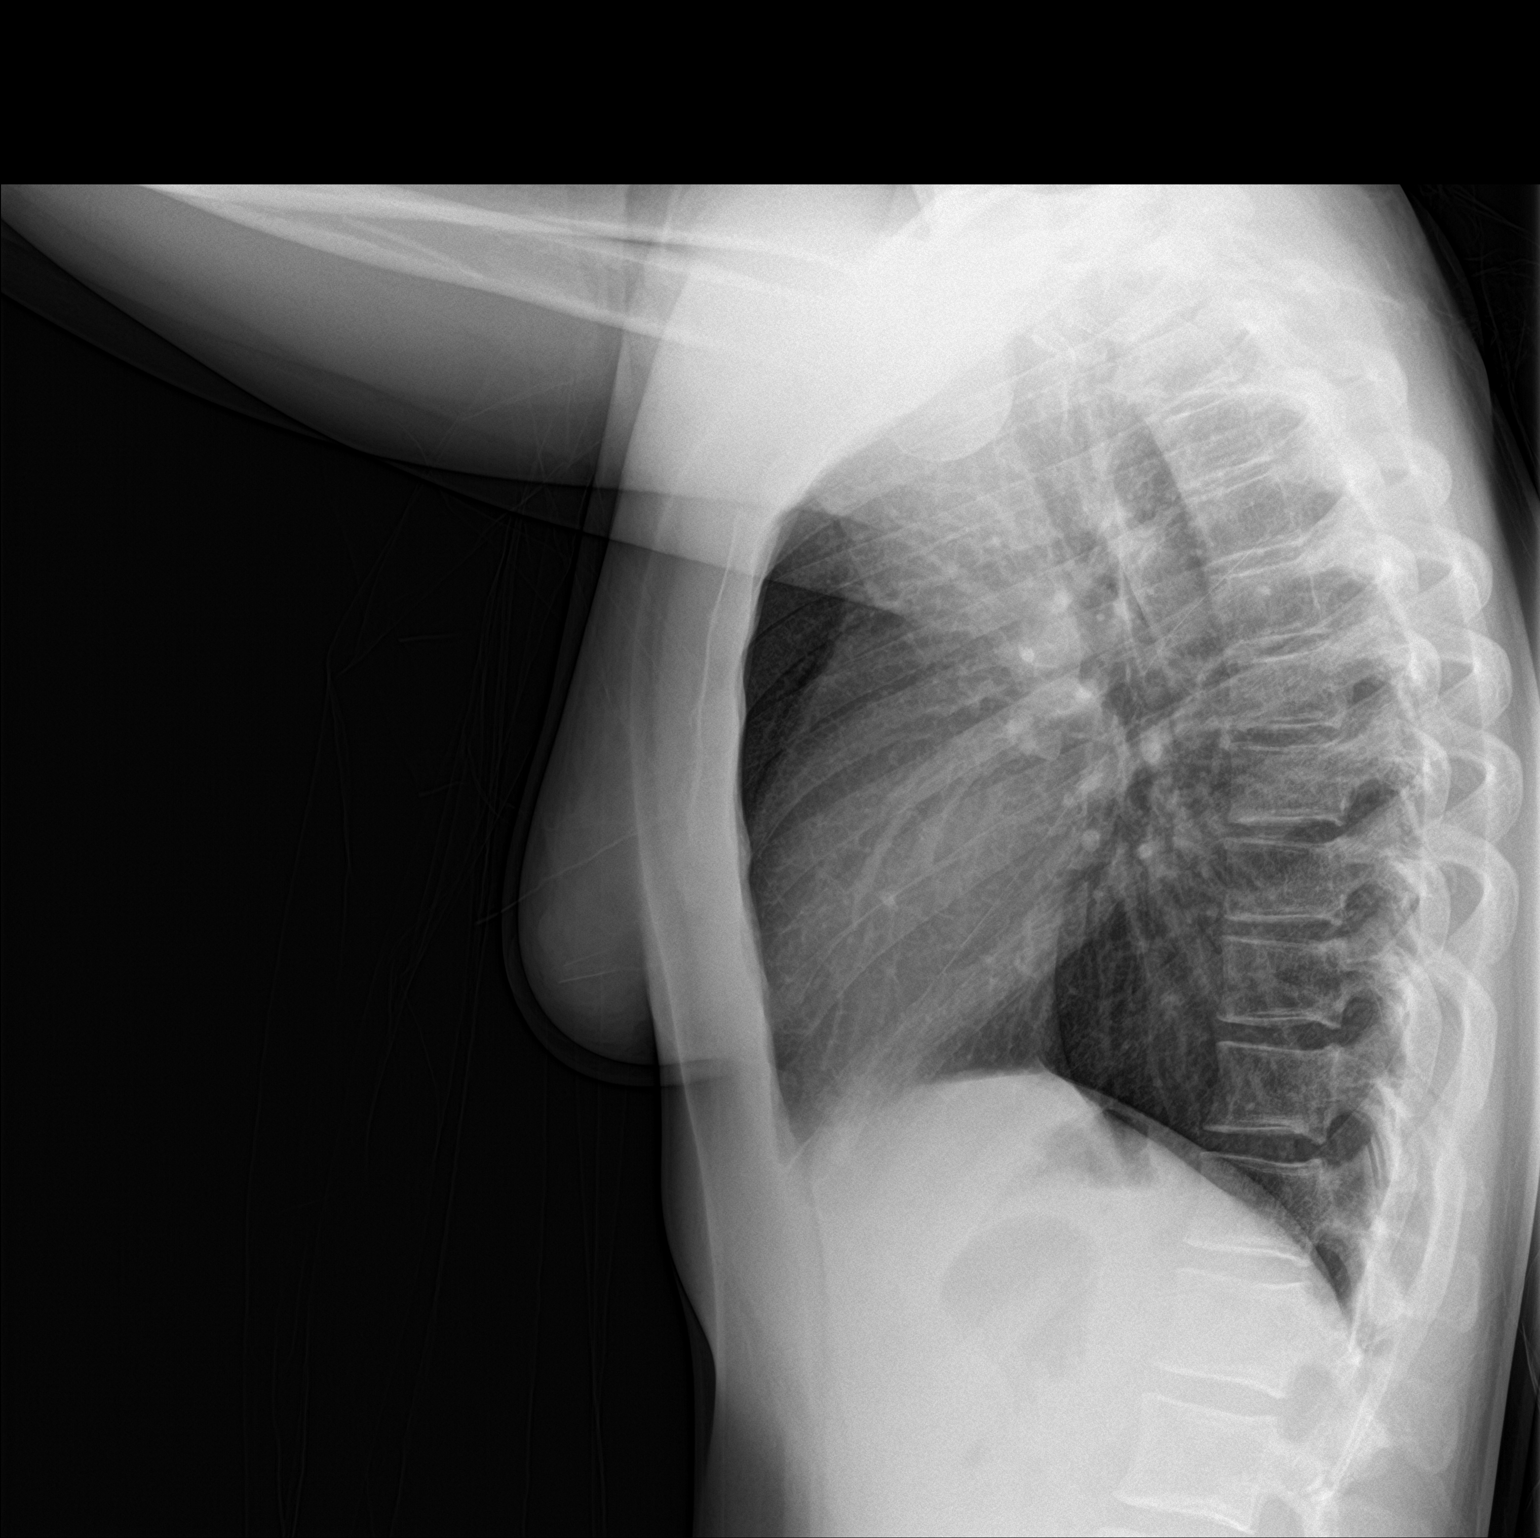

[2 of 2 positions shown; findings below may reference images not displayed]

FINDINGS: The lungs are well-aerated and clear. There is no evidence of focal
opacification, pleural effusion or pneumothorax.

The heart is normal in size; the mediastinal contour is within
normal limits. No acute osseous abnormalities are seen.
IMPRESSION: No acute cardiopulmonary process seen. No displaced rib fractures
identified.

## 2019-01-01 ENCOUNTER — Ambulatory Visit (INDEPENDENT_AMBULATORY_CARE_PROVIDER_SITE_OTHER): Payer: Medicaid Other | Admitting: Maternal Newborn

## 2019-01-01 ENCOUNTER — Other Ambulatory Visit: Payer: Self-pay

## 2019-01-01 ENCOUNTER — Encounter: Payer: Self-pay | Admitting: Maternal Newborn

## 2019-01-01 VITALS — BP 110/62 | Wt 163.0 lb

## 2019-01-01 DIAGNOSIS — Z3480 Encounter for supervision of other normal pregnancy, unspecified trimester: Secondary | ICD-10-CM

## 2019-01-01 DIAGNOSIS — Z3483 Encounter for supervision of other normal pregnancy, third trimester: Secondary | ICD-10-CM

## 2019-01-01 DIAGNOSIS — Z3A38 38 weeks gestation of pregnancy: Secondary | ICD-10-CM

## 2019-01-01 IMAGING — CT CT ABD-PELV W/ CM
2 of 4 series · 15 of 46 positions shown, 17 images · IV contrast (APPLIED)
Comparison: Pelvic ultrasound performed 02/27/2012

CLINICAL DATA: Acute onset of right-sided rib pain.  Nausea.

EXAM:
CT ABDOMEN AND PELVIS WITH CONTRAST
TECHNIQUE: Multidetector CT imaging of the abdomen and pelvis was performed
using the standard protocol following bolus administration of
intravenous contrast.
CONTRAST:  100mL [id number] IOPAMIDOL ([id number]) INJECTION 61%

[Series 2: routine abd/pel with · axial · 0.69mm/px · z∈[-851,-481]mm · 12 of 85 slices shown, 14 images]
[im 7/85  soft-tissue]
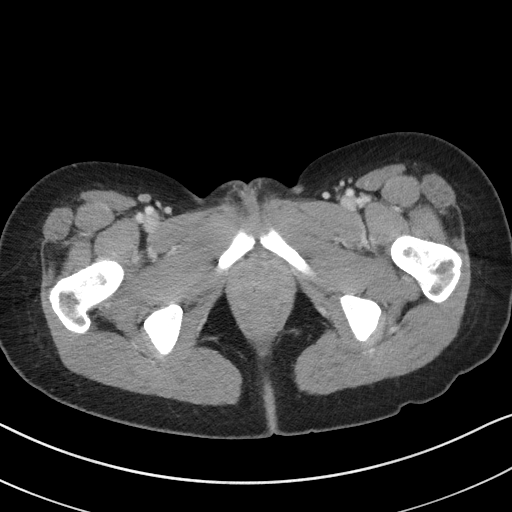
[im 7/85  bone]
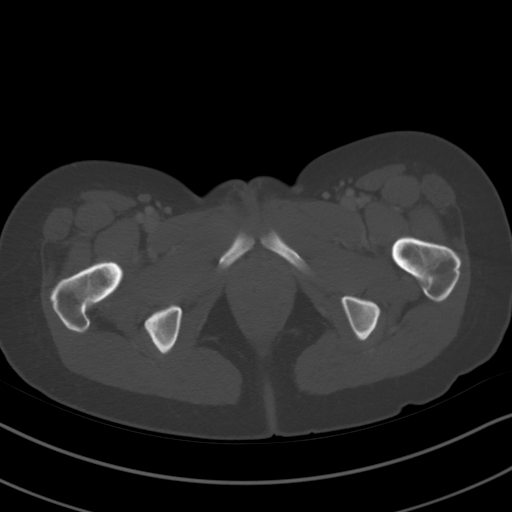
[im 14/85  soft-tissue]
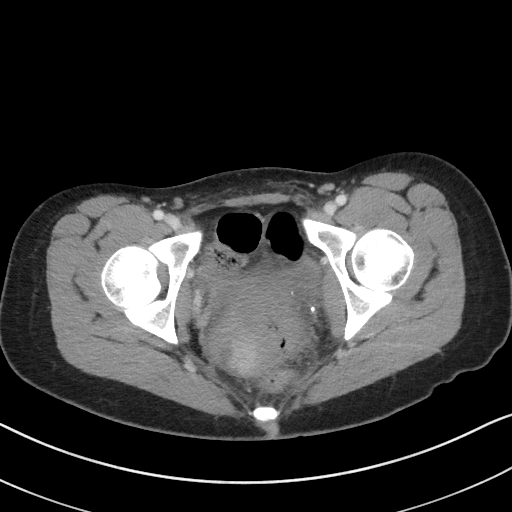
[im 21/85  soft-tissue]
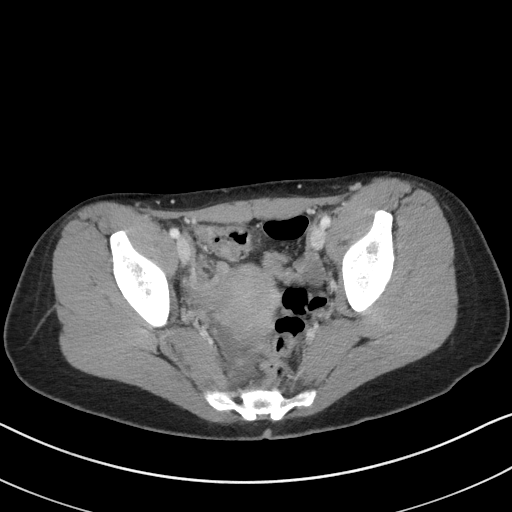
[im 27/85  soft-tissue]
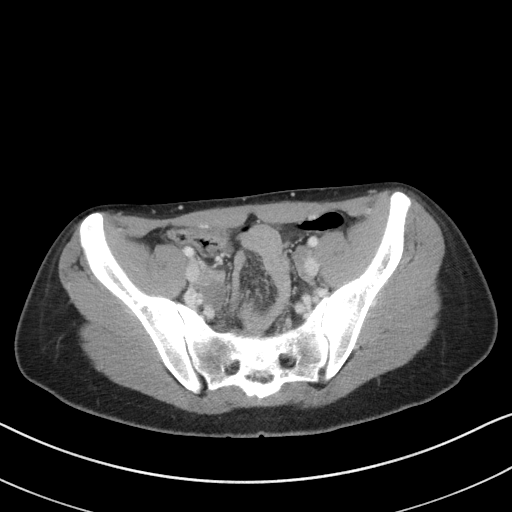
[im 34/85  soft-tissue]
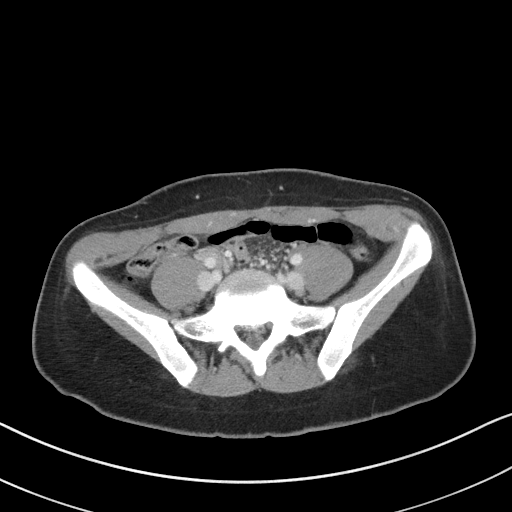
[im 41/85  soft-tissue]
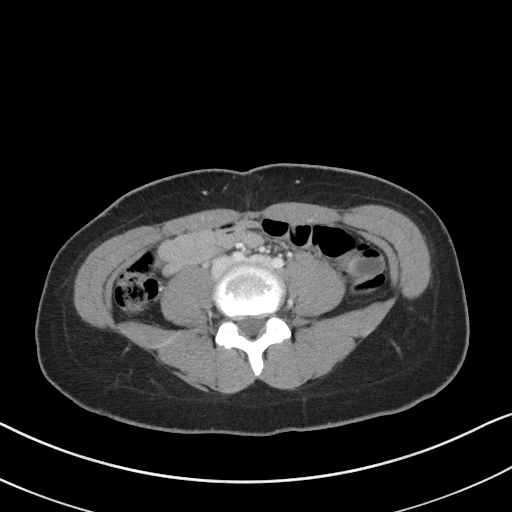
[im 48/85  soft-tissue]
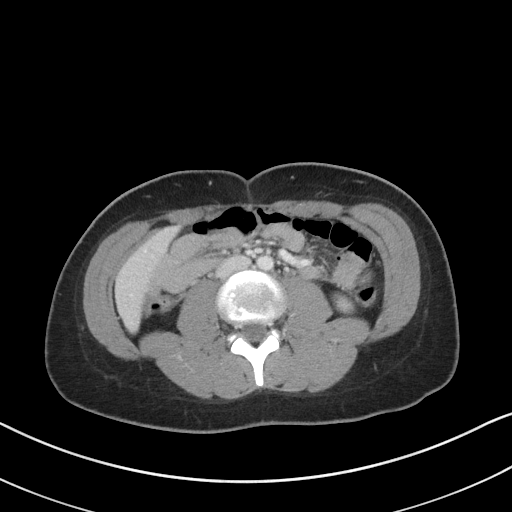
[im 54/85  soft-tissue]
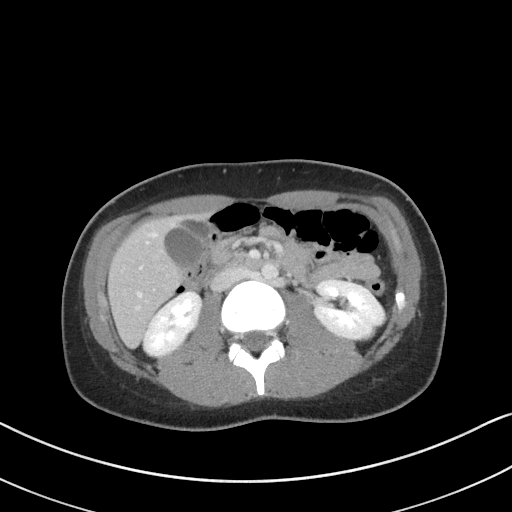
[im 61/85  soft-tissue]
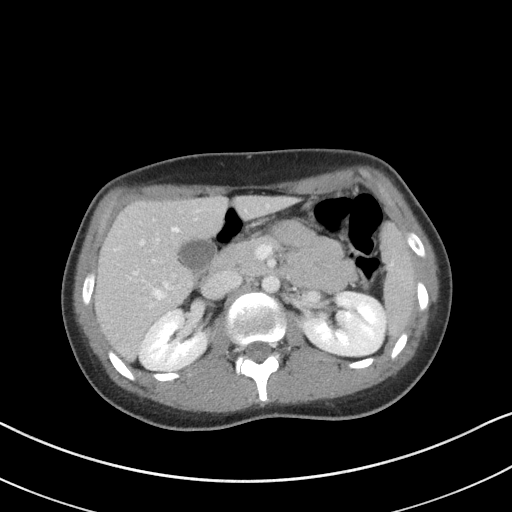
[im 61/85  bone]
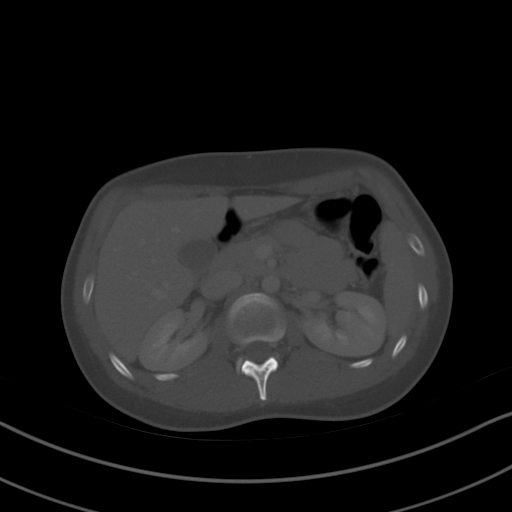
[im 68/85  soft-tissue]
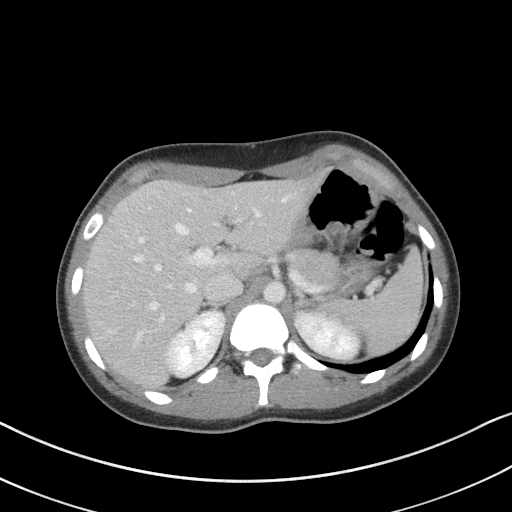
[im 74/85  soft-tissue]
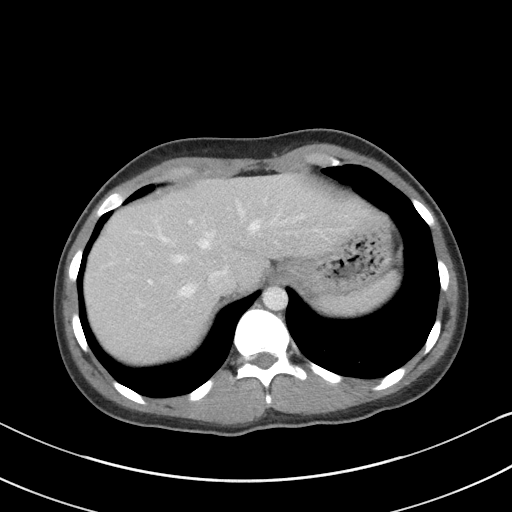
[im 81/85  soft-tissue]
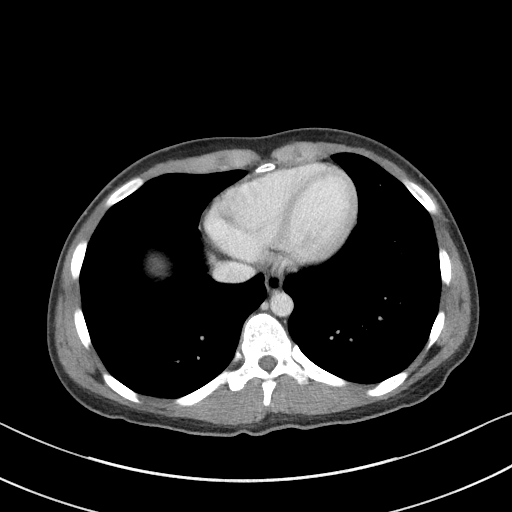

[Series 5: coronal st · coronal · 0.60mm/px · 3 of 65 slices shown]
[im 22/65  soft-tissue]
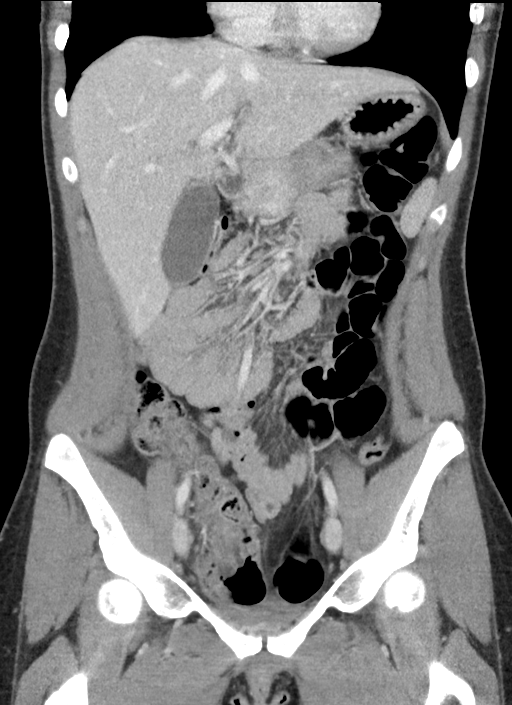
[im 29/65  soft-tissue]
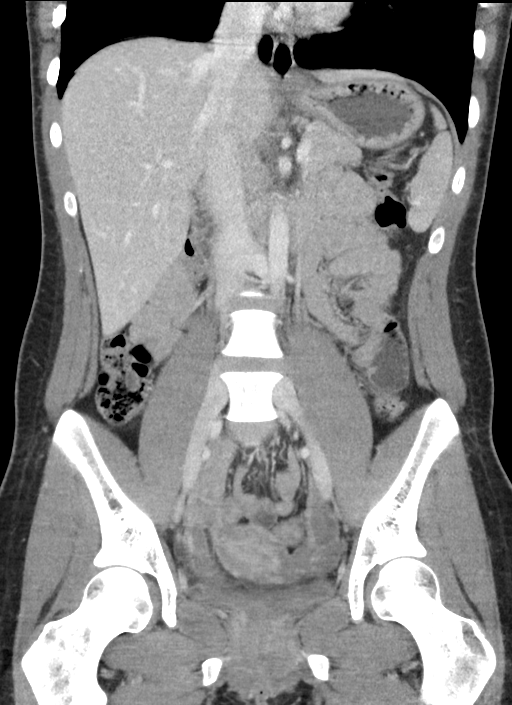
[im 36/65  soft-tissue]
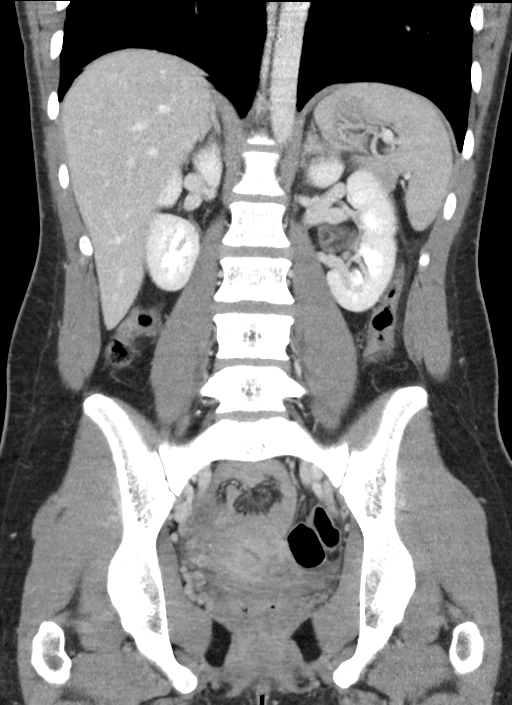

[15 of 46 positions shown; findings below may reference images not displayed]

FINDINGS: Lower chest: The visualized lung bases are grossly clear. The
visualized portions of the mediastinum are unremarkable.

Hepatobiliary: The liver is unremarkable in appearance. The
gallbladder is unremarkable in appearance. The common bile duct
remains normal in caliber.

Pancreas: The pancreas is within normal limits.

Spleen: The spleen is unremarkable in appearance.

Adrenals/Urinary Tract: The adrenal glands are unremarkable in
appearance. The kidneys are within normal limits. There is no
evidence of hydronephrosis. No renal or ureteral stones are
identified. No perinephric stranding is seen.

Stomach/Bowel: There appears to be dilatation of the appendix to
cm in diameter on coronal images, raising concern for acute
appendicitis. The appendix is retrocecal in nature, seen at the
right hemipelvis. There is mild associated wall thickening at the
cecum, and mild surrounding soft tissue inflammation. There is no
definite evidence of perforation or abscess formation at this time.

Appendix: Location: Retrocecal, at the right hemipelvis. Cecum is
noted at the upper right hemipelvis.

Diameter: 1.1 cm

Appendicolith: No

Mucosal hyper-enhancement: Question of minimal mucosal
hyperenhancement

Extraluminal gas: No

Periappendiceal collection: No

The colon is unremarkable in appearance. The small bowel is grossly
unremarkable. The stomach is within normal limits.

Vascular/Lymphatic: The abdominal aorta is unremarkable in
appearance. The inferior vena cava is grossly unremarkable. No
retroperitoneal lymphadenopathy is seen. No pelvic sidewall
lymphadenopathy is identified.

Reproductive: The bladder is decompressed and not well
characterized. The uterus is unremarkable in appearance. The ovaries
are relatively symmetric. No suspicious adnexal masses are seen.
Trace free fluid within the pelvis may reflect the appendicitis or
may be physiologic in nature.

Other: No additional soft tissue abnormalities are seen.

Musculoskeletal: No acute osseous abnormalities are identified. The
visualized musculature is unremarkable in appearance.
IMPRESSION: Dilatation of the appendix to 1.1 cm in diameter on coronal images,
raising concern for acute appendicitis. The appendix is retrocecal
in nature, seen at the right hemipelvis. No evidence of perforation
or abscess formation. Mild associated wall thickening at the cecum.
Surrounding soft tissue inflammation noted.

These results were called by telephone at the time of interpretation
on 05/21/2017 at [DATE] to Dr. DAVIDA TIGER, who verbally
acknowledged these results.

## 2019-01-01 NOTE — Progress Notes (Signed)
ROB C/o having a hard time sleeping, having some contractions   Denies lof, no vb Good FM Desires cervical check and talk about membrane sweep

## 2019-01-01 NOTE — Patient Instructions (Signed)

## 2019-01-01 NOTE — Progress Notes (Signed)
    Routine Prenatal Care Visit  Subjective  Stephanie Navarro is a 23 y.o. G2P1001 at [redacted]w[redacted]d being seen today for ongoing prenatal care.  She is currently monitored for the following issues for this low-risk pregnancy and has Supervision of other normal pregnancy, antepartum on their problem list.  ----------------------------------------------------------------------------------- Patient reports some Braxton-Hicks contractions and occasional round ligament pain.   Contractions: Not present. Vag. Bleeding: None.  Movement: Present. No leaking of fluid.  ----------------------------------------------------------------------------------- The following portions of the patient's history were reviewed and updated as appropriate: allergies, current medications, past family history, past medical history, past social history, past surgical history and problem list. Problem list updated.   Objective  Blood pressure 110/62, weight 163 lb (73.9 kg), last menstrual period 03/29/2018. Pregravid weight 126 lb (57.2 kg) Total Weight Gain 37 lb (16.8 kg)  Fetal Status: Fetal Heart Rate (bpm): 142 Fundal Height: 37 cm Movement: Present     General:  Alert, oriented and cooperative. Patient is in no acute distress.  Skin: Skin is warm and dry. No rash noted.   Cardiovascular: Normal heart rate noted  Respiratory: Normal respiratory effort, no problems with respiration noted  Abdomen: Soft, gravid, appropriate for gestational age. Pain/Pressure: Present     Pelvic:  Cervical exam performed Dilation: 2 Effacement (%): 40, 50 Station: -3, -2  Extremities: Normal range of motion.  Edema: None  Mental Status: Normal mood and affect. Normal behavior. Normal judgment and thought content.     Assessment   23 y.o. G2P1001 at [redacted]w[redacted]d, EDD 01/14/2019 by Ultrasound presenting for a routine prenatal visit.  Plan   pregnancy 2 Problems (from 03/29/18 to present)    Problem Noted Resolved   Supervision of other  normal pregnancy, antepartum 08/19/2018 by Rod Can, CNM No   Overview Addendum 12/21/2018 10:45 AM by Homero Fellers, MD    [facility] Prenatal Labs  Dating  LMP = 18 wk [place] Blood type:   O positive  Genetic Screen None Antibody:  negative  Anatomic [place] Complete [ x] pyelectasis, follow up after birth Rubella:   IMMUNE Varicella: Varivax x2  GTT   Third trimester: 104 RPR:   nonreactive  Rhogam  not needed HBsAg:   negative  TDaP vaccine  Flu Shot: declined 12/27 HIV:   negative  Baby Food   Bottle                             GBS:   Contraception Nexplanon Pap: needs postpartum  CBB     CS/VBAC NA   Support Person Cousin Katlyn           Discussed membrane sweeping, will wait until appointment next week for this if no labor before that time.  Term labor symptoms and general obstetric precautions including but not limited to vaginal bleeding, contractions, leaking of fluid and fetal movement were reviewed.  Please refer to After Visit Summary for other counseling recommendations.   Return in about 1 week (around 01/08/2019) for ROB.  Avel Sensor, CNM 01/01/2019  11:47 AM

## 2019-01-05 ENCOUNTER — Ambulatory Visit (INDEPENDENT_AMBULATORY_CARE_PROVIDER_SITE_OTHER): Payer: Medicaid Other | Admitting: Maternal Newborn

## 2019-01-05 ENCOUNTER — Other Ambulatory Visit: Payer: Self-pay

## 2019-01-05 VITALS — BP 120/80 | Wt 163.0 lb

## 2019-01-05 DIAGNOSIS — Z3483 Encounter for supervision of other normal pregnancy, third trimester: Secondary | ICD-10-CM

## 2019-01-05 DIAGNOSIS — Z3480 Encounter for supervision of other normal pregnancy, unspecified trimester: Secondary | ICD-10-CM

## 2019-01-05 DIAGNOSIS — Z3A38 38 weeks gestation of pregnancy: Secondary | ICD-10-CM

## 2019-01-05 NOTE — Progress Notes (Signed)
    Routine Prenatal Care Visit  Subjective  Stephanie Navarro is a 23 y.o. G2P1001 at [redacted]w[redacted]d being seen today for ongoing prenatal care.  She is currently monitored for the following issues for this low-risk pregnancy and has Supervision of other normal pregnancy, antepartum on their problem list.  ----------------------------------------------------------------------------------- Patient reports intermittent contractions that began yesterday, and were coming frequently then and again this morning. The contractions have slowed down after she took a bath and rested, and are now irregular (about every 25 minutes). She has pelvic pressure. Contractions: Not present. Vag. Bleeding: None.  Movement: Present. No leaking of fluid.  ----------------------------------------------------------------------------------- The following portions of the patient's history were reviewed and updated as appropriate: allergies, current medications, past family history, past medical history, past social history, past surgical history and problem list. Problem list updated.   Objective  Blood pressure 120/80, weight 163 lb (73.9 kg), last menstrual period 03/29/2018. Pregravid weight 126 lb (57.2 kg) Total Weight Gain 37 lb (16.8 kg)  Fetal Status: Fetal Heart Rate (bpm): 131   Movement: Present     General:  Alert, oriented and cooperative. Patient is in no acute distress.  Skin: Skin is warm and dry. No rash noted.   Cardiovascular: Normal heart rate noted  Respiratory: Normal respiratory effort, no problems with respiration noted  Abdomen: Soft, gravid, appropriate for gestational age. Pain/Pressure: Present     Pelvic:  Cervical exam performed Dilation: 2.5 Effacement (%): 50 Station: -3  Extremities: Normal range of motion.  Edema: None  Mental Status: Normal mood and affect. Normal behavior. Normal judgment and thought content.     Assessment   23 y.o. G2P1001 at [redacted]w[redacted]d, EDD 01/14/2019 by Ultrasound  presenting for a work-in prenatal visit.  Plan   pregnancy 2 Problems (from 03/29/18 to present)    Problem Noted Resolved   Supervision of other normal pregnancy, antepartum 08/19/2018 by Rod Can, CNM No   Overview Addendum 12/21/2018 10:45 AM by Homero Fellers, MD    [facility] Prenatal Labs  Dating  LMP = 18 wk [place] Blood type:   O positive  Genetic Screen None Antibody:  negative  Anatomic [place] Complete [ x] pyelectasis, follow up after birth Rubella:   IMMUNE Varicella: Varivax x2  GTT   Third trimester: 104 RPR:   nonreactive  Rhogam  not needed HBsAg:   negative  TDaP vaccine  Flu Shot: declined 12/27 HIV:   negative  Baby Food   Bottle                             GBS:   Contraception Nexplanon Pap: needs postpartum  CBB     CS/VBAC NA   Support Person Lonia Farber           Not currently in labor, discussed term labor symptoms and going to triage on Labor and Delivery as needed.  Keep scheduled ROB on 7/31.  Avel Sensor, CNM 01/05/2019  11:40 AM

## 2019-01-06 ENCOUNTER — Encounter: Payer: Self-pay | Admitting: Maternal Newborn

## 2019-01-08 ENCOUNTER — Encounter: Payer: Self-pay | Admitting: Certified Nurse Midwife

## 2019-01-08 ENCOUNTER — Other Ambulatory Visit: Payer: Self-pay

## 2019-01-08 ENCOUNTER — Ambulatory Visit (INDEPENDENT_AMBULATORY_CARE_PROVIDER_SITE_OTHER): Payer: Medicaid Other | Admitting: Certified Nurse Midwife

## 2019-01-08 VITALS — BP 94/60 | Wt 159.6 lb

## 2019-01-08 DIAGNOSIS — Z3483 Encounter for supervision of other normal pregnancy, third trimester: Secondary | ICD-10-CM

## 2019-01-08 DIAGNOSIS — Z3A39 39 weeks gestation of pregnancy: Secondary | ICD-10-CM | POA: Diagnosis not present

## 2019-01-08 DIAGNOSIS — Z01812 Encounter for preprocedural laboratory examination: Secondary | ICD-10-CM

## 2019-01-08 DIAGNOSIS — Z3480 Encounter for supervision of other normal pregnancy, unspecified trimester: Secondary | ICD-10-CM

## 2019-01-08 NOTE — Progress Notes (Signed)
ROB- would like cervix check 

## 2019-01-09 ENCOUNTER — Encounter: Payer: Self-pay | Admitting: Certified Nurse Midwife

## 2019-01-09 NOTE — Progress Notes (Signed)
ROB at 39wk1d: Doing well. + pelvic pressure. Was seen earlier this week for contractions-cervix 2.5cm. No regular contractions, bleeding or leakage of fluid since then. Interested in induction of labor Baby active. GBS negative.  Cervix is unchanged: 2.5/50%/-2 FHTs WNL/ vertex Bottle/ Nexplanon P: Scheduled for IOL on 01/14/2019 Explained that  IOL would be elective at this point Will need Covid 19 testing on 3 or 4 August. Labor precautions  Dalia Heading, CNM

## 2019-01-11 ENCOUNTER — Other Ambulatory Visit: Payer: Self-pay

## 2019-01-11 ENCOUNTER — Ambulatory Visit
Admission: RE | Admit: 2019-01-11 | Discharge: 2019-01-11 | Disposition: A | Discharge status: Home / Self Care | Payer: Medicaid Other | Source: Ambulatory Visit | Attending: Certified Nurse Midwife | Admitting: Certified Nurse Midwife

## 2019-01-11 DIAGNOSIS — Z20828 Contact with and (suspected) exposure to other viral communicable diseases: Secondary | ICD-10-CM | POA: Insufficient documentation

## 2019-01-11 DIAGNOSIS — Z01812 Encounter for preprocedural laboratory examination: Secondary | ICD-10-CM | POA: Insufficient documentation

## 2019-01-11 LAB — SARS CORONAVIRUS 2 (TAT 6-24 HRS): SARS Coronavirus 2: NEGATIVE

## 2019-01-14 ENCOUNTER — Other Ambulatory Visit: Payer: Self-pay

## 2019-01-14 ENCOUNTER — Inpatient Hospital Stay: Payer: Medicaid Other | Admitting: Anesthesiology

## 2019-01-14 ENCOUNTER — Inpatient Hospital Stay
Admission: RE | Admit: 2019-01-14 | Discharge: 2019-01-16 | DRG: 806 | Disposition: A | Discharge status: Home / Self Care | Payer: Medicaid Other | Attending: Obstetrics and Gynecology | Admitting: Obstetrics and Gynecology

## 2019-01-14 DIAGNOSIS — A568 Sexually transmitted chlamydial infection of other sites: Secondary | ICD-10-CM | POA: Diagnosis present

## 2019-01-14 DIAGNOSIS — Z3490 Encounter for supervision of normal pregnancy, unspecified, unspecified trimester: Secondary | ICD-10-CM | POA: Diagnosis present

## 2019-01-14 DIAGNOSIS — D62 Acute posthemorrhagic anemia: Secondary | ICD-10-CM | POA: Diagnosis not present

## 2019-01-14 DIAGNOSIS — O9081 Anemia of the puerperium: Secondary | ICD-10-CM | POA: Diagnosis not present

## 2019-01-14 DIAGNOSIS — O9832 Other infections with a predominantly sexual mode of transmission complicating childbirth: Secondary | ICD-10-CM | POA: Diagnosis present

## 2019-01-14 DIAGNOSIS — O26893 Other specified pregnancy related conditions, third trimester: Secondary | ICD-10-CM | POA: Diagnosis present

## 2019-01-14 DIAGNOSIS — O99334 Smoking (tobacco) complicating childbirth: Secondary | ICD-10-CM | POA: Diagnosis present

## 2019-01-14 DIAGNOSIS — O480 Post-term pregnancy: Secondary | ICD-10-CM

## 2019-01-14 DIAGNOSIS — F17210 Nicotine dependence, cigarettes, uncomplicated: Secondary | ICD-10-CM | POA: Diagnosis present

## 2019-01-14 DIAGNOSIS — Z3A40 40 weeks gestation of pregnancy: Secondary | ICD-10-CM | POA: Diagnosis not present

## 2019-01-14 DIAGNOSIS — A749 Chlamydial infection, unspecified: Secondary | ICD-10-CM | POA: Diagnosis present

## 2019-01-14 DIAGNOSIS — O98813 Other maternal infectious and parasitic diseases complicating pregnancy, third trimester: Secondary | ICD-10-CM | POA: Diagnosis present

## 2019-01-14 DIAGNOSIS — Z3480 Encounter for supervision of other normal pregnancy, unspecified trimester: Secondary | ICD-10-CM

## 2019-01-14 HISTORY — DX: Other maternal infectious and parasitic diseases complicating pregnancy, third trimester: A74.9

## 2019-01-14 LAB — CBC
HCT: 33.4 % — ABNORMAL LOW (ref 36.0–46.0)
Hemoglobin: 11 g/dL — ABNORMAL LOW (ref 12.0–15.0)
MCH: 28.9 pg (ref 26.0–34.0)
MCHC: 32.9 g/dL (ref 30.0–36.0)
MCV: 87.9 fL (ref 80.0–100.0)
Platelets: 154 10*3/uL (ref 150–400)
RBC: 3.8 MIL/uL — ABNORMAL LOW (ref 3.87–5.11)
RDW: 13.6 % (ref 11.5–15.5)
WBC: 8.9 10*3/uL (ref 4.0–10.5)
nRBC: 0 % (ref 0.0–0.2)

## 2019-01-14 LAB — CHLAMYDIA/NGC RT PCR (ARMC ONLY)
Chlamydia Tr: DETECTED — AB
N gonorrhoeae: NOT DETECTED

## 2019-01-14 LAB — URINE DRUG SCREEN, QUALITATIVE (ARMC ONLY)
Amphetamines, Ur Screen: NOT DETECTED
Barbiturates, Ur Screen: NOT DETECTED
Benzodiazepine, Ur Scrn: NOT DETECTED
Cannabinoid 50 Ng, Ur ~~LOC~~: POSITIVE — AB
Cocaine Metabolite,Ur ~~LOC~~: NOT DETECTED
MDMA (Ecstasy)Ur Screen: NOT DETECTED
Methadone Scn, Ur: NOT DETECTED
Opiate, Ur Screen: NOT DETECTED
Phencyclidine (PCP) Ur S: NOT DETECTED
Tricyclic, Ur Screen: NOT DETECTED

## 2019-01-14 MED ORDER — OXYTOCIN 40 UNITS IN NORMAL SALINE INFUSION - SIMPLE MED
1.0000 m[IU]/min | INTRAVENOUS | Status: DC
  Administered 2019-01-14: 1 m[IU]/min via INTRAVENOUS

## 2019-01-14 MED ORDER — SODIUM CHLORIDE 0.9 % IV SOLN
INTRAVENOUS | Status: DC | PRN
  Administered 2019-01-14 (×2): 5 mL via EPIDURAL

## 2019-01-14 MED ORDER — LACTATED RINGERS IV SOLN: 500.0000 mL | INTRAVENOUS | Status: DC | PRN

## 2019-01-14 MED ORDER — BUTORPHANOL TARTRATE 1 MG/ML IJ SOLN: 1.0000 mg | INTRAMUSCULAR | Status: DC | PRN

## 2019-01-14 MED ORDER — AMMONIA AROMATIC IN INHA
RESPIRATORY_TRACT | Status: AC
  Filled 2019-01-14: qty 10

## 2019-01-14 MED ORDER — PHENYLEPHRINE 40 MCG/ML (10ML) SYRINGE FOR IV PUSH (FOR BLOOD PRESSURE SUPPORT)
80.0000 ug | PREFILLED_SYRINGE | INTRAVENOUS | Status: DC | PRN
  Filled 2019-01-14: qty 10

## 2019-01-14 MED ORDER — LACTATED RINGERS IV SOLN
INTRAVENOUS | Status: DC
  Administered 2019-01-14: 09:00:00 via INTRAVENOUS

## 2019-01-14 MED ORDER — AMMONIA AROMATIC IN INHA: 0.3000 mL | Freq: Once | RESPIRATORY_TRACT | Status: DC | PRN

## 2019-01-14 MED ORDER — MISOPROSTOL 25 MCG QUARTER TABLET
25.0000 ug | ORAL_TABLET | Freq: Once | ORAL | Status: AC
  Administered 2019-01-14: 09:00:00 25 ug via ORAL

## 2019-01-14 MED ORDER — ONDANSETRON HCL 4 MG/2ML IJ SOLN
4.0000 mg | Freq: Four times a day (QID) | INTRAMUSCULAR | Status: DC | PRN
  Administered 2019-01-14: 22:00:00 4 mg via INTRAVENOUS

## 2019-01-14 MED ORDER — LIDOCAINE-EPINEPHRINE (PF) 1.5 %-1:200000 IJ SOLN
INTRAMUSCULAR | Status: DC | PRN
  Administered 2019-01-14: 3 mL via EPIDURAL

## 2019-01-14 MED ORDER — OXYTOCIN 40 UNITS IN NORMAL SALINE INFUSION - SIMPLE MED: 2.5000 [IU]/h | INTRAVENOUS | Status: DC

## 2019-01-14 MED ORDER — EPHEDRINE 5 MG/ML INJ
10.0000 mg | INTRAVENOUS | Status: DC | PRN
  Filled 2019-01-14: qty 2

## 2019-01-14 MED ORDER — LIDOCAINE HCL (PF) 1 % IJ SOLN
INTRAMUSCULAR | Status: DC | PRN
  Administered 2019-01-14 (×3): 1 mL via INTRADERMAL

## 2019-01-14 MED ORDER — FENTANYL 2.5 MCG/ML W/ROPIVACAINE 0.15% IN NS 100 ML EPIDURAL (ARMC)
12.0000 mL/h | EPIDURAL | Status: DC
  Administered 2019-01-14: 20:00:00 12 mL/h via EPIDURAL

## 2019-01-14 MED ORDER — LACTATED RINGERS IV SOLN
500.0000 mL | Freq: Once | INTRAVENOUS | Status: AC
  Administered 2019-01-14: 500 mL via INTRAVENOUS

## 2019-01-14 MED ORDER — FENTANYL 2.5 MCG/ML W/ROPIVACAINE 0.15% IN NS 100 ML EPIDURAL (ARMC)
EPIDURAL | Status: AC
  Filled 2019-01-14: qty 100

## 2019-01-14 MED ORDER — MISOPROSTOL 200 MCG PO TABS: 800.0000 ug | ORAL_TABLET | Freq: Once | ORAL | Status: DC | PRN

## 2019-01-14 MED ORDER — ONDANSETRON HCL 4 MG/2ML IJ SOLN
INTRAMUSCULAR | Status: AC
  Filled 2019-01-14: qty 2

## 2019-01-14 MED ORDER — MISOPROSTOL 25 MCG QUARTER TABLET
25.0000 ug | ORAL_TABLET | Freq: Once | ORAL | Status: AC
  Administered 2019-01-14: 25 ug via VAGINAL

## 2019-01-14 MED ORDER — DIPHENHYDRAMINE HCL 50 MG/ML IJ SOLN: 12.5000 mg | INTRAMUSCULAR | Status: DC | PRN

## 2019-01-14 MED ORDER — MISOPROSTOL 200 MCG PO TABS
ORAL_TABLET | ORAL | Status: AC
  Filled 2019-01-14: qty 4

## 2019-01-14 MED ORDER — OXYTOCIN 10 UNIT/ML IJ SOLN
INTRAMUSCULAR | Status: AC
  Filled 2019-01-14: qty 2

## 2019-01-14 MED ORDER — OXYTOCIN BOLUS FROM INFUSION
500.0000 mL | Freq: Once | INTRAVENOUS | Status: AC
  Administered 2019-01-14: 500 mL via INTRAVENOUS

## 2019-01-14 MED ORDER — LIDOCAINE HCL (PF) 1 % IJ SOLN
INTRAMUSCULAR | Status: AC
  Filled 2019-01-14: qty 30

## 2019-01-14 MED ORDER — MISOPROSTOL 25 MCG QUARTER TABLET
ORAL_TABLET | ORAL | Status: AC
  Filled 2019-01-14: qty 1

## 2019-01-14 MED ORDER — AZITHROMYCIN 500 MG PO TABS
1000.0000 mg | ORAL_TABLET | Freq: Once | ORAL | Status: AC
  Administered 2019-01-14: 1000 mg via ORAL
  Filled 2019-01-14: qty 2

## 2019-01-14 MED ORDER — TERBUTALINE SULFATE 1 MG/ML IJ SOLN: 0.2500 mg | Freq: Once | INTRAMUSCULAR | Status: DC | PRN

## 2019-01-14 MED ORDER — LIDOCAINE HCL (PF) 1 % IJ SOLN: 30.0000 mL | INTRAMUSCULAR | Status: DC | PRN

## 2019-01-14 NOTE — H&P (Signed)
OB History & Physical   History of Present Illness:  Chief Complaint:   HPI:  DYNISHA DUE is a 23 y.o. G38P1001 female at [redacted]w[redacted]d dated by an 18wk6day ultrasound.  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 She presents to L&D for elective induction of labor     Prenatal care site: Prenatal care at [place] has also been remarkable for  [facility] Prenatal Labs  Dating  LMP = 18 wk [place] Blood type:   O positive  Genetic Screen None Antibody:  negative  Anatomic [place] Complete [ x] pyelectasis, follow up after birth Rubella:   IMMUNE Varicella: Varivax x2  GTT   Third trimester: 104 RPR:   nonreactive  Rhogam  not needed HBsAg:   negative  TDaP vaccine  Flu Shot: declined 12/27 HIV:   negative  Baby Food   Bottle                             GBS: negative  Contraception Nexplanon Pap: needs postpartum  CBB     CS/VBAC NA   Support Person Cousin Katlyn         Maternal Medical History:   Past Medical History:  Diagnosis Date  . Acute appendicitis with localized peritonitis 05/21/2017  . Encounter for procreative genetic counseling 06/16/2018   Formatting of this note might be different from the original. Genetic counseling visit on 06/24/2018  Aneuploidy screening/ testing:  Genetic counseling visit pending  Carrier screening:  Genetic counseling visit pending   WGS study eligible: no  GENETIC COUNSELING PREVISIT SUMMARY Referring provider: [facility]  Indication: FTS Other relevant history:  Insurance: [id number] P - (Medicaid)  M    Past Surgical History:  Procedure Laterality Date  . APPENDECTOMY  05/21/2017   Dr. Burt Knack   . LAPAROSCOPIC APPENDECTOMY N/A 05/21/2017   Procedure: APPENDECTOMY LAPAROSCOPIC;  Surgeon: Florene Glen, MD;  Location: [facility] ORS;  Service: General;  Laterality: N/A;     No Known Allergies  Prior to Admission medications   Medication Sig Start Date End Date Taking? Authorizing Provider  Prenatal Vit-Fe Fumarate-FA (PNV PRENATAL PLUS MULTIVITAMIN) 27-1 MG TABS Take by mouth.   Yes [provider]   OB History  Gravida Para Term Preterm AB Living  2 1 1     1   SAB TAB Ectopic Multiple Live Births          1    # Outcome Date GA Lbr Len/2nd Weight Sex Delivery Anes PTL Lv  2 Current           1 Term 06/05/12 [redacted]w[redacted]d  3232 g M Vag-Spont   LIV         Social History: She  reports that she has been smoking cigarettes. She has been smoking about 0.50 packs per day. She has never used smokeless tobacco. She reports that she does not drink alcohol or use drugs.  Family History: negative for hypertension, diabetes, heart disease and cancer  Review of Systems: Negative x 10 systems reviewed except as noted in the HPI.      Physical Exam:  Vital Signs: BP 114/72 (BP Location: Left Arm)   Pulse 85   Temp 98.5 F (36.9 C) (Oral)   Resp 16   Ht 5\' 3"  (1.6  m)   Wt 72.6 kg   LMP 03/29/2018 (Within Weeks)   SpO2 98%   BMI 28.34 kg/m  General: no acute distress.  HEENT: normocephalic, atraumatic Heart: regular rate & rhythm.  No murmurs/rubs/gallops Lungs: clear to auscultation bilaterally Abdomen: soft, gravid, non-tender;  EFW: 7#4oz Pelvic:   External: Normal external female genitalia  Cervix: 2.5cm/50%/-2 to -1 posterior  Extremities: non-tender, symmetric, no edema bilaterally.  DTRs: +1  Neurologic: Alert & oriented x 3.   Baseline FHR: 130 baseline with accelerations to 150s, moderate variability Toco: irregular, mild contractions   Assessment:  Olena LeatherwoodChelsea L Latterell is a 23 y.o. 192P1001 female at 3138w0d presents for elective induction of labor Bishop score 4-5 FWB: Cat 1 tracing  Plan:  1. Admit to Labor & Delivery  For induction of labor. Discussed risks of induction including hyperstimulation, fetal intolerance, Cesarean  section. She is aware that this is an elective induction and it may take a while to get her into labor. She wishes to proceed with induction. Will start with 25 mcg Cytotec vaginally and 25 mcg buccally.  2. CBC, T&S, Clrs, IVF 3. GBS negative.   4. Consents obtained. 5. O POS/ RI/ Varivax x 2 6. Bottle 7. Nexplanon 8. Plans epidural for pain relief.    Farrel ConnersColleen April Carlyon  01/14/2019

## 2019-01-14 NOTE — Discharge Summary (Signed)
OB Discharge Summary     Patient Name: Stephanie Navarro DOB: 01-Apr-1996 MRN: [id number]  Date of admission: 01/14/2019 Delivering MD: Prentice Docker, MD  Date of Delivery: 01/14/2019  Date of discharge: 01/16/2019  Admitting diagnosis: Induction of labor Intrauterine pregnancy: [redacted]w[redacted]d     Secondary diagnosis: Chlamydia diagnosis on admission     Discharge diagnosis: Term Pregnancy Delivered and chlamydia trachomatis                                                                                                Post partum procedures:none  Augmentation: AROM, Pitocin and Cytotec  Complications: None  Hospital course:  Induction of Labor With Vaginal Delivery   23 y.o. yo G2P1001 at [redacted]w[redacted]d was admitted to the hospital 01/14/2019 for induction of labor.  Indication for induction: elective induction of labor.  Patient had an uncomplicated labor course as follows: Membrane Rupture Time/Date: 9:22 PM ,01/14/2019   Intrapartum Procedures: Episiotomy: None [1]                                         Lacerations:  2nd degree [3];Vaginal [6];Perineal [11]  Patient had delivery of a Viable infant.  Information for the patient's newborn:  Joshlynn, Alfonzo [[id number]]  Delivery Method: Vag-Spont    01/14/2019  Details of delivery can be found in separate delivery note.  Patient had a routine postpartum course. Patient is discharged home 01/16/19.  Physical exam  Vitals:   01/15/19 1109 01/15/19 1554 01/15/19 2345 01/16/19 0756  BP: 107/70 104/61 (!) 90/56 118/80  Pulse: 72 62 62 (!) 59  Resp: 20 20 20 20   Temp: 98.8 F (37.1 C) 98.5 F (36.9 C) 98.7 F (37.1 C) 98.6 F (37 C)  TempSrc: Oral Oral Oral Oral  SpO2:  100% 100% 100%  Weight:      Height:       General: alert, cooperative and no distress Lochia: appropriate Uterine Fundus: firm Incision: N/A DVT Evaluation: No evidence of DVT seen on physical exam.  Labs: Lab Results  Component Value Date   WBC 16.3 (H) 01/15/2019    HGB 10.0 (L) 01/15/2019   HCT 30.5 (L) 01/15/2019   MCV 87.1 01/15/2019   PLT 141 (L) 01/15/2019    Discharge instruction: per After Visit Summary.  Medications:  Allergies as of 01/16/2019   No Known Allergies     Medication List    TAKE these medications   acetaminophen 325 MG tablet Commonly known as: Tylenol Take 2 tablets (650 mg total) by mouth every 4 (four) hours as needed (for pain scale < 4).   ibuprofen 600 MG tablet Commonly known as: ADVIL Take 1 tablet (600 mg total) by mouth every 6 (six) hours.   PNV Prenatal Plus Multivitamin 27-1 MG Tabs Take by mouth.            Discharge Care Instructions  (From admission, onward)         Start     Ordered   01/16/19  0000  Discharge wound care:    Comments: SHOWER DAILY Wash incision gently with soap and water.  Call office with any drainage, redness, or firmness of the incision.   01/16/19 1037          Diet: routine diet  Activity: Advance as tolerated. Pelvic rest for 6 weeks.   Outpatient follow up: Follow-up Information    Conard NovakJackson, Stephen D, MD. Schedule an appointment as soon as possible for a visit in 6 week(s).   Specialty: Obstetrics and Gynecology Contact information: [address] [place] [address] [phone]             Postpartum contraception: Nexplanon Rhogam Given postpartum: no Rubella vaccine given postpartum: no Varicella vaccine given postpartum: no TDaP given antepartum or postpartum: 11/11/2018  Newborn Data: Live born female  Birth Weight:   APGAR: 8, 9  Newborn Delivery   Birth date/time: 01/14/2019 23:18:00 Delivery type: Vaginal, Spontaneous       Baby Feeding: Bottle  Disposition:home with mother  SIGNED:  Adelene Idlerhristanna Osamah Schmader MD [facility], [facility] 01/16/2019 10:38 AM

## 2019-01-14 NOTE — Anesthesia Procedure Notes (Signed)
Epidural Patient location during procedure: OB Start time: 01/14/2019 7:38 PM End time: 01/14/2019 7:52 PM  Staffing Anesthesiologist: Jonel Weldon, Precious Haws, MD Performed: anesthesiologist   Preanesthetic Checklist Completed: patient identified, site marked, surgical consent, pre-op evaluation, timeout performed, IV checked, risks and benefits discussed and monitors and equipment checked  Epidural Patient position: sitting Prep: ChloraPrep Patient monitoring: heart rate, continuous pulse ox and blood pressure Approach: midline Location: L4-L5 Injection technique: LOR saline  Needle:  Needle type: Tuohy  Needle gauge: 17 G Needle length: 9 cm and 9 Needle insertion depth: 6 cm Catheter type: closed end flexible Catheter size: 19 Gauge Catheter at skin depth: 11 cm Test dose: negative and 1.5% lidocaine with Epi 1:200 K  Assessment Sensory level: T10 Events: blood not aspirated, injection not painful, no injection resistance, negative IV test and no paresthesia  Additional Notes 3 attempts.  False loss and unable to threat catheter with first 2 attempts. Pt. Evaluated and documentation done after procedure finished. Patient identified. Risks/Benefits/Options discussed with patient including but not limited to bleeding, infection, nerve damage, paralysis, failed block, incomplete pain control, headache, blood pressure changes, nausea, vomiting, reactions to medication both or allergic, itching and postpartum back pain. Confirmed with bedside nurse the patient's most recent platelet count. Confirmed with patient that they are not currently taking any anticoagulation, have any bleeding history or any family history of bleeding disorders. Patient expressed understanding and wished to proceed. All questions were answered. Sterile technique was used throughout the entire procedure. Please see nursing notes for vital signs. Test dose was given through epidural catheter and negative prior to  continuing to dose epidural or start infusion. Warning signs of high block given to the patient including shortness of breath, tingling/numbness in hands, complete motor block, or any concerning symptoms with instructions to call for help. Patient was given instructions on fall risk and not to get out of bed. All questions and concerns addressed with instructions to call with any issues or inadequate analgesia.   Patient tolerated the insertion well without immediate complications.Reason for block:procedure for pain

## 2019-01-14 NOTE — Anesthesia Preprocedure Evaluation (Signed)
Anesthesia Evaluation  Patient identified by MRN, date of birth, ID band Patient awake    Reviewed: Allergy & Precautions, H&P , NPO status , Patient's Chart, lab work & pertinent test results  History of Anesthesia Complications Negative for: history of anesthetic complications  Airway Mallampati: III  TM Distance: >3 FB Neck ROM: full    Dental  (+) Chipped   Pulmonary Current Smoker and Patient abstained from smoking.,           Cardiovascular Exercise Tolerance: Good (-) hypertensionnegative cardio ROS       Neuro/Psych    GI/Hepatic negative GI ROS,   Endo/Other    Renal/GU   negative genitourinary   Musculoskeletal   Abdominal   Peds  Hematology negative hematology ROS (+)   Anesthesia Other Findings Past Medical History: 05/21/2017: Acute appendicitis with localized peritonitis 06/16/2018: Encounter for procreative genetic counseling     Comment:  Formatting of this note might be different from the               original. Genetic counseling visit on 06/24/2018                Aneuploidy screening/ testing:  Genetic counseling visit               pending  Carrier screening:  Genetic counseling visit               pending   WGS study eligible: no  GENETIC COUNSELING               PREVISIT SUMMARY Referring provider: [facility]                Indication: FTS Other relevant history:  Insurance:               [id number] P - (Medicaid)  M  Past Surgical History: 05/21/2017: APPENDECTOMY     Comment:  Dr. Burt Knack  05/21/2017: LAPAROSCOPIC APPENDECTOMY; N/A     Comment:  Procedure: APPENDECTOMY LAPAROSCOPIC;  Surgeon: Florene Glen, MD;  Location: [facility] ORS;  Service: General;                Laterality: N/A;  BMI    Body Mass Index: 28.34 kg/m      Reproductive/Obstetrics (+) Pregnancy                             Anesthesia Physical Anesthesia Plan  ASA:  II  Anesthesia Plan: Epidural   Post-op Pain Management:    Induction:   PONV Risk Score and Plan:   Airway Management Planned:   Additional Equipment:   Intra-op Plan:   Post-operative Plan:   Informed Consent: I have reviewed the patients History and Physical, chart, labs and discussed the procedure including the risks, benefits and alternatives for the proposed anesthesia with the patient or authorized representative who has indicated his/her understanding and acceptance.       Plan Discussed with: Anesthesiologist  Anesthesia Plan Comments:         Anesthesia Quick Evaluation

## 2019-01-15 LAB — CBC
HCT: 30.5 % — ABNORMAL LOW (ref 36.0–46.0)
Hemoglobin: 10 g/dL — ABNORMAL LOW (ref 12.0–15.0)
MCH: 28.6 pg (ref 26.0–34.0)
MCHC: 32.8 g/dL (ref 30.0–36.0)
MCV: 87.1 fL (ref 80.0–100.0)
Platelets: 141 10*3/uL — ABNORMAL LOW (ref 150–400)
RBC: 3.5 MIL/uL — ABNORMAL LOW (ref 3.87–5.11)
RDW: 13.5 % (ref 11.5–15.5)
WBC: 16.3 10*3/uL — ABNORMAL HIGH (ref 4.0–10.5)
nRBC: 0 % (ref 0.0–0.2)

## 2019-01-15 LAB — TYPE AND SCREEN
ABO/RH(D): O POS
Antibody Screen: NEGATIVE

## 2019-01-15 LAB — RPR: RPR Ser Ql: NONREACTIVE

## 2019-01-15 MED ORDER — COCONUT OIL OIL: 1.0000 "application " | TOPICAL_OIL | Status: DC | PRN

## 2019-01-15 MED ORDER — BENZOCAINE-MENTHOL 20-0.5 % EX AERO: 1.0000 "application " | INHALATION_SPRAY | CUTANEOUS | Status: DC | PRN

## 2019-01-15 MED ORDER — IBUPROFEN 600 MG PO TABS
600.0000 mg | ORAL_TABLET | Freq: Four times a day (QID) | ORAL | Status: DC
  Administered 2019-01-15 – 2019-01-16 (×4): 600 mg via ORAL
  Filled 2019-01-15 (×5): qty 1

## 2019-01-15 MED ORDER — SIMETHICONE 80 MG PO CHEW: 80.0000 mg | CHEWABLE_TABLET | ORAL | Status: DC | PRN

## 2019-01-15 MED ORDER — FERROUS SULFATE 325 (65 FE) MG PO TABS
325.0000 mg | ORAL_TABLET | Freq: Two times a day (BID) | ORAL | Status: DC
  Administered 2019-01-15 – 2019-01-16 (×3): 325 mg via ORAL
  Filled 2019-01-15 (×3): qty 1

## 2019-01-15 MED ORDER — WITCH HAZEL-GLYCERIN EX PADS: 1.0000 "application " | MEDICATED_PAD | CUTANEOUS | Status: DC | PRN

## 2019-01-15 MED ORDER — PRENATAL MULTIVITAMIN CH
1.0000 | ORAL_TABLET | Freq: Every day | ORAL | Status: DC
  Administered 2019-01-15 – 2019-01-16 (×2): 1 via ORAL
  Filled 2019-01-15 (×2): qty 1

## 2019-01-15 MED ORDER — HYDROCODONE-ACETAMINOPHEN 5-325 MG PO TABS
1.0000 | ORAL_TABLET | Freq: Four times a day (QID) | ORAL | Status: DC | PRN
  Administered 2019-01-15: 1 via ORAL
  Filled 2019-01-15: qty 1

## 2019-01-15 MED ORDER — ONDANSETRON HCL 4 MG PO TABS: 4.0000 mg | ORAL_TABLET | ORAL | Status: DC | PRN

## 2019-01-15 MED ORDER — DIBUCAINE (PERIANAL) 1 % EX OINT: 1.0000 "application " | TOPICAL_OINTMENT | CUTANEOUS | Status: DC | PRN

## 2019-01-15 MED ORDER — SENNOSIDES-DOCUSATE SODIUM 8.6-50 MG PO TABS
2.0000 | ORAL_TABLET | ORAL | Status: DC
  Administered 2019-01-15 – 2019-01-16 (×2): 2 via ORAL
  Filled 2019-01-15 (×2): qty 2

## 2019-01-15 MED ORDER — ACETAMINOPHEN 325 MG PO TABS: 650.0000 mg | ORAL_TABLET | ORAL | Status: DC | PRN

## 2019-01-15 MED ORDER — ONDANSETRON HCL 4 MG/2ML IJ SOLN: 4.0000 mg | INTRAMUSCULAR | Status: DC | PRN

## 2019-01-15 MED ORDER — DIPHENHYDRAMINE HCL 25 MG PO CAPS: 25.0000 mg | ORAL_CAPSULE | Freq: Four times a day (QID) | ORAL | Status: DC | PRN
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
## 2019-01-15 NOTE — Progress Notes (Signed)
PPD#1 SVD Subjective:  Well-appearing, denies pain. Voiding without difficulty. Tolerating a regular diet. Ambulating well.  Objective:   Blood pressure 106/74, pulse (!) 54, temperature 98.6 F (37 C), temperature source Oral, resp. rate 20, height 5\' 3"  (1.6 m), weight 72.6 kg, last menstrual period 03/29/2018, SpO2 100 %, unknown if currently breastfeeding.  General: NAD Pulmonary: no increased work of breathing Abdomen: non-distended, non-tender Uterus:  fundus firm; lochia appropriate Extremities: no signs of DVT  Results for orders placed or performed during the hospital encounter of 01/14/19 (from the past 72 hour(s))  Chlamydia/NGC rt PCR ([facility] only)     Status: Abnormal   Collection Time: 01/14/19  8:30 AM   Specimen: Urine; GU  Result Value Ref Range   Specimen source GC/Chlam URINE, RANDOM    Chlamydia Tr DETECTED (A) NOT DETECTED   N gonorrhoeae NOT DETECTED NOT DETECTED    Comment: (NOTE) This CT/NG assay has not been evaluated in patients with a history of  hysterectomy. Performed at [facility], [address] [place], [place] [address]   Urine Drug Screen, Qualitative [facility] only)     Status: Abnormal   Collection Time: 01/14/19  8:58 AM  Result Value Ref Range   Tricyclic, Ur Screen NONE DETECTED NONE DETECTED   Amphetamines, Ur Screen NONE DETECTED NONE DETECTED   MDMA (Ecstasy)Ur Screen NONE DETECTED NONE DETECTED   Cocaine Metabolite,Ur [place] NONE DETECTED NONE DETECTED   Opiate, Ur Screen NONE DETECTED NONE DETECTED   Phencyclidine (PCP) Ur S NONE DETECTED NONE DETECTED   Cannabinoid 50 Ng, Ur [place] POSITIVE (A) NONE DETECTED   Barbiturates, Ur Screen NONE DETECTED NONE DETECTED   Benzodiazepine, Ur Scrn NONE DETECTED NONE DETECTED   Methadone Scn, Ur NONE DETECTED NONE DETECTED    Comment: (NOTE) Tricyclics + metabolites, urine    Cutoff 1000 ng/mL Amphetamines + metabolites, urine  Cutoff 1000 ng/mL MDMA (Ecstasy), urine              Cutoff 500  ng/mL Cocaine Metabolite, urine          Cutoff 300 ng/mL Opiate + metabolites, urine        Cutoff 300 ng/mL Phencyclidine (PCP), urine         Cutoff 25 ng/mL Cannabinoid, urine                 Cutoff 50 ng/mL Barbiturates + metabolites, urine  Cutoff 200 ng/mL Benzodiazepine, urine              Cutoff 200 ng/mL Methadone, urine                   Cutoff 300 ng/mL The urine drug screen provides only a preliminary, unconfirmed analytical test result and should not be used for non-medical purposes. Clinical consideration and professional judgment should be applied to any positive drug screen result due to possible interfering substances. A more specific alternate chemical method must be used in order to obtain a confirmed analytical result. Gas chromatography / mass spectrometry (GC/MS) is the preferred confirmat ory method. Performed at [facility], [address] [place], [place] [address]   Type and screen [facility]     Status: None   Collection Time: 01/14/19  9:09 AM  Result Value Ref Range   ABO/RH(D) O POS    Antibody Screen NEG    Sample Expiration      01/17/2019,2359 Performed at [facility], [address] [place], [place]  [address]   CBC     Status: Abnormal   Collection Time: 01/14/19  9:09 AM  Result Value Ref Range   WBC 8.9 4.0 - 10.5 K/uL   RBC 3.80 (L) 3.87 - 5.11 MIL/uL   Hemoglobin 11.0 (L) 12.0 - 15.0 g/dL   HCT 33.4 (L) 36.0 - 46.0 %   MCV 87.9 80.0 - 100.0 fL   MCH 28.9 26.0 - 34.0 pg   MCHC 32.9 30.0 - 36.0 g/dL   RDW 13.6 11.5 - 15.5 %   Platelets 154 150 - 400 K/uL   nRBC 0.0 0.0 - 0.2 %    Comment: Performed at [facility], [address] [place], [place] [address]  RPR     Status: None   Collection Time: 01/14/19  9:09 AM  Result Value Ref Range   RPR Ser Ql Non Reactive Non Reactive    Comment: (NOTE) Performed At: [facility] [place], [place]  [id number] Rush Farmer MD QI:[id number]   CBC     Status: Abnormal   Collection Time: 01/15/19  5:43 AM  Result Value Ref Range   WBC 16.3 (H) 4.0 - 10.5 K/uL   RBC 3.50 (L) 3.87 - 5.11 MIL/uL   Hemoglobin 10.0 (L) 12.0 - 15.0 g/dL   HCT 30.5 (L) 36.0 - 46.0 %   MCV 87.1 80.0 - 100.0 fL   MCH 28.6 26.0 - 34.0 pg   MCHC 32.8 30.0 - 36.0 g/dL   RDW 13.5 11.5 - 15.5 %   Platelets 141 (L) 150 - 400 K/uL   nRBC 0.0 0.0 - 0.2 %    Comment: Performed at [facility], [address], [place], [place] [address]    Assessment:   23 y.o. [phone] postpartum day #1 in good condition.  Plan:   1) Acute blood loss anemia - hemodynamically stable and asymptomatic - PO ferrous sulfate  2) Blood Type --/--/O POS (08/06 0909)   3) Rubella Immune/ Varicella: had vaccine x 2 / TDAP: received antepartum 11/11/2018  4) Formula feeding  5) Contraception: plans Nexplanon  6) Disposition: continue postpartum care.  Avel Sensor, CNM 01/15/2019  9:41 AM

## 2019-01-16 LAB — RAPID HIV SCREEN (HIV 1/2 AB+AG)
HIV 1/2 Antibodies: NONREACTIVE
HIV-1 P24 Antigen - HIV24: NONREACTIVE

## 2019-01-16 MED ORDER — ACETAMINOPHEN 325 MG PO TABS: 650.0000 mg | ORAL_TABLET | ORAL | 1 refills | Status: DC | PRN

## 2019-01-16 MED ORDER — IBUPROFEN 600 MG PO TABS: 600.0000 mg | ORAL_TABLET | Freq: Four times a day (QID) | ORAL | 0 refills | Status: DC

## 2019-01-16 NOTE — Anesthesia Postprocedure Evaluation (Signed)
Anesthesia Post Note  Patient: Stephanie Navarro  Procedure(s) Performed: AN AD HOC LABOR EPIDURAL  Patient location during evaluation: Mother Baby Anesthesia Type: Epidural Level of consciousness: awake and alert Pain management: pain level controlled Vital Signs Assessment: post-procedure vital signs reviewed and stable Respiratory status: spontaneous breathing, nonlabored ventilation and respiratory function stable Cardiovascular status: stable Postop Assessment: no headache, no backache and epidural receding Anesthetic complications: no     Last Vitals:  Vitals:   01/15/19 2345 01/16/19 0756  BP: (!) 90/56 118/80  Pulse: 62 (!) 59  Resp: 20 20  Temp: 37.1 C 37 C  SpO2: 100% 100%    Last Pain:  Vitals:   01/16/19 0820  TempSrc:   PainSc: 0-No pain                 KEPHART,WILLIAM K

## 2019-01-16 NOTE — Progress Notes (Signed)
Education provided on need for TDaP vaccine.  Pt declines vaccine at this time. Reed Breech, RN 01/16/2019 12:46 PM

## 2019-01-16 NOTE — Discharge Instructions (Signed)
Call your doctor for increased pain or vaginal bleeding, temperature above 100.4, depression, or concerns.  Continue prenatal vitamin and iron.  No strenuous activity or heavy lifting for 6 weeks.  No intercourse, tampons, or douching for 6 weeks.  No tub baths- showers only.  No driving for 2 weeks.

## 2019-01-16 NOTE — Progress Notes (Signed)
Admit Date: 01/14/2019 Today's Date: 01/16/2019  Post Partum Day 2  Subjective:  no complaints, up ad lib, voiding, tolerating PO and + flatus  Desires discharge home  Objective: Temp:  [98.5 F (36.9 C)-98.8 F (37.1 C)] 98.6 F (37 C) (08/08 0756) Pulse Rate:  [59-72] 59 (08/08 0756) Resp:  [20] 20 (08/08 0756) BP: (90-118)/(56-80) 118/80 (08/08 0756) SpO2:  [100 %] 100 % (08/08 0756)  Physical Exam:  General: alert, cooperative and appears stated age 23: appropriate Uterine Fundus: firm Incision: none DVT Evaluation: No evidence of DVT seen on physical exam.  Recent Labs    01/14/19 0909 01/15/19 0543  HGB 11.0* 10.0*  HCT 33.4* 30.5*    Assessment/Plan: Bottle Feeding and Infant doing well  39 yo s/p SVD PPD#2 O+, RI, VI Formula feeding Nexplanon desired for birth control Discharge home   LOS: 2 days   [place] 01/16/2019, 10:23 AM

## 2019-01-16 NOTE — Progress Notes (Signed)
Discharge instructions provided.  Pt and sig other verbalize understanding of all instructions and follow-up care.  Pt discharged to home with infant at 1327 on 01/16/19 via wheelchair by NT. Reed Breech, RN 01/16/2019 6:02 PM

## 2019-01-18 NOTE — Progress Notes (Signed)
8/10: Clinical Education officer, museum (CSW) contacted [employer] (CPS) today and spoke to [place] [phone]. Per CPS worker there is an open case. CSW made CPS worker aware that infant's cord tissue is positive for THC. The rest of patient's cord tissue screen is still pending.   [employer], LCSW [phone]

## 2019-02-26 ENCOUNTER — Ambulatory Visit: Payer: Medicaid Other | Admitting: Obstetrics and Gynecology

## 2019-03-23 ENCOUNTER — Other Ambulatory Visit: Payer: Medicaid Other

## 2019-04-05 ENCOUNTER — Telehealth: Payer: Self-pay | Admitting: Obstetrics and Gynecology

## 2019-04-05 NOTE — Telephone Encounter (Signed)
Patient is schedule 04/23/19 with SDJ

## 2019-04-05 NOTE — Telephone Encounter (Signed)
Patient is schedule for nexplanon insertion / 6 week PP for 04/23/19 at 9:50 with SDJ

## 2019-04-06 NOTE — Telephone Encounter (Signed)
Noted. Will order to arrive by apt date/time. 

## 2019-04-23 ENCOUNTER — Ambulatory Visit: Payer: Medicaid Other | Admitting: Obstetrics and Gynecology

## 2019-05-10 NOTE — Telephone Encounter (Signed)
Patient is reschedule to 05/20/19 at [facility] with SDJ

## 2019-05-20 ENCOUNTER — Ambulatory Visit: Payer: Medicaid Other | Admitting: Obstetrics and Gynecology

## 2019-07-08 ENCOUNTER — Telehealth: Payer: Self-pay | Admitting: Obstetrics and Gynecology

## 2019-07-08 NOTE — Telephone Encounter (Signed)
3/1 at 350 for nexplanon with SDJ

## 2019-07-12 NOTE — Telephone Encounter (Signed)
Noted. Will order to arrive by apt date/time. 

## 2019-08-09 ENCOUNTER — Ambulatory Visit: Payer: Medicaid Other | Admitting: Obstetrics and Gynecology

## 2019-08-19 NOTE — Telephone Encounter (Signed)
Patient No Show apt 08/09/2018

## 2019-08-23 NOTE — Telephone Encounter (Signed)
Pt r/s to 08/09/2019. Pt no show all visits. Nexplanon not rcvd.

## 2020-01-04 ENCOUNTER — Other Ambulatory Visit: Payer: Self-pay

## 2020-01-04 ENCOUNTER — Encounter: Payer: Self-pay | Admitting: Emergency Medicine

## 2020-01-04 ENCOUNTER — Inpatient Hospital Stay
Admission: EM | Admit: 2020-01-04 | Discharge: 2020-01-07 | Disposition: A | Discharge status: Home / Self Care | Payer: Medicaid Other | Attending: Internal Medicine | Admitting: Internal Medicine

## 2020-01-04 ENCOUNTER — Emergency Department: Payer: Medicaid Other

## 2020-01-04 ENCOUNTER — Inpatient Hospital Stay: Payer: Medicaid Other

## 2020-01-04 DIAGNOSIS — O99331 Smoking (tobacco) complicating pregnancy, first trimester: Secondary | ICD-10-CM | POA: Insufficient documentation

## 2020-01-04 DIAGNOSIS — E876 Hypokalemia: Secondary | ICD-10-CM | POA: Insufficient documentation

## 2020-01-04 DIAGNOSIS — O99281 Endocrine, nutritional and metabolic diseases complicating pregnancy, first trimester: Secondary | ICD-10-CM | POA: Diagnosis not present

## 2020-01-04 DIAGNOSIS — E871 Hypo-osmolality and hyponatremia: Secondary | ICD-10-CM | POA: Insufficient documentation

## 2020-01-04 DIAGNOSIS — O26891 Other specified pregnancy related conditions, first trimester: Secondary | ICD-10-CM | POA: Diagnosis not present

## 2020-01-04 DIAGNOSIS — Z20822 Contact with and (suspected) exposure to covid-19: Secondary | ICD-10-CM | POA: Insufficient documentation

## 2020-01-04 DIAGNOSIS — B9620 Unspecified Escherichia coli [E. coli] as the cause of diseases classified elsewhere: Secondary | ICD-10-CM | POA: Insufficient documentation

## 2020-01-04 DIAGNOSIS — N12 Tubulo-interstitial nephritis, not specified as acute or chronic: Secondary | ICD-10-CM

## 2020-01-04 DIAGNOSIS — Z3A01 Less than 8 weeks gestation of pregnancy: Secondary | ICD-10-CM

## 2020-01-04 DIAGNOSIS — O219 Vomiting of pregnancy, unspecified: Secondary | ICD-10-CM | POA: Diagnosis not present

## 2020-01-04 DIAGNOSIS — R1010 Upper abdominal pain, unspecified: Secondary | ICD-10-CM | POA: Diagnosis present

## 2020-01-04 DIAGNOSIS — N10 Acute tubulo-interstitial nephritis: Secondary | ICD-10-CM

## 2020-01-04 DIAGNOSIS — F17210 Nicotine dependence, cigarettes, uncomplicated: Secondary | ICD-10-CM | POA: Diagnosis not present

## 2020-01-04 DIAGNOSIS — G932 Benign intracranial hypertension: Secondary | ICD-10-CM

## 2020-01-04 DIAGNOSIS — Z331 Pregnant state, incidental: Secondary | ICD-10-CM

## 2020-01-04 DIAGNOSIS — R109 Unspecified abdominal pain: Secondary | ICD-10-CM | POA: Insufficient documentation

## 2020-01-04 DIAGNOSIS — O2301 Infections of kidney in pregnancy, first trimester: Principal | ICD-10-CM | POA: Insufficient documentation

## 2020-01-04 DIAGNOSIS — R519 Headache, unspecified: Secondary | ICD-10-CM | POA: Diagnosis not present

## 2020-01-04 HISTORY — DX: Pregnant state, incidental: Z33.1

## 2020-01-04 LAB — URINALYSIS, COMPLETE (UACMP) WITH MICROSCOPIC
Bilirubin Urine: NEGATIVE
Glucose, UA: >=500 mg/dL — AB
Ketones, ur: 5 mg/dL — AB
Nitrite: POSITIVE — AB
Protein, ur: >=300 mg/dL — AB
Specific Gravity, Urine: 1.014 (ref 1.005–1.030)
WBC, UA: >50 WBC/hpf — ABNORMAL HIGH (ref 0–5)
pH: 5 (ref 5.0–8.0)

## 2020-01-04 LAB — CBC
HCT: 38.3 % (ref 36.0–46.0)
Hemoglobin: 13 g/dL (ref 12.0–15.0)
MCH: 30.1 pg (ref 26.0–34.0)
MCHC: 33.9 g/dL (ref 30.0–36.0)
MCV: 88.7 fL (ref 80.0–100.0)
Platelets: 151 10*3/uL (ref 150–400)
RBC: 4.32 MIL/uL (ref 3.87–5.11)
RDW: 13.1 % (ref 11.5–15.5)
WBC: 22.7 10*3/uL — ABNORMAL HIGH (ref 4.0–10.5)
nRBC: 0 % (ref 0.0–0.2)

## 2020-01-04 LAB — COMPREHENSIVE METABOLIC PANEL
ALT: 16 U/L (ref 0–44)
AST: 22 U/L (ref 15–41)
Albumin: 3.7 g/dL (ref 3.5–5.0)
Alkaline Phosphatase: 78 U/L (ref 38–126)
Anion gap: 13 (ref 5–15)
BUN: 12 mg/dL (ref 6–20)
CO2: 18 mmol/L — ABNORMAL LOW (ref 22–32)
Calcium: 8.8 mg/dL — ABNORMAL LOW (ref 8.9–10.3)
Chloride: 99 mmol/L (ref 98–111)
Creatinine, Ser: 1.19 mg/dL — ABNORMAL HIGH (ref 0.44–1.00)
GFR calc Af Amer: >60 mL/min (ref >60)
GFR calc non Af Amer: >60 mL/min (ref >60)
Glucose, Bld: 209 mg/dL — ABNORMAL HIGH (ref 70–99)
Potassium: 2.8 mmol/L — ABNORMAL LOW (ref 3.5–5.1)
Sodium: 130 mmol/L — ABNORMAL LOW (ref 135–145)
Total Bilirubin: 0.9 mg/dL (ref 0.3–1.2)
Total Protein: 7.9 g/dL (ref 6.5–8.1)

## 2020-01-04 LAB — MAGNESIUM: Magnesium: 1.5 mg/dL — ABNORMAL LOW (ref 1.7–2.4)

## 2020-01-04 LAB — POCT PREGNANCY, URINE: Preg Test, Ur: POSITIVE — AB

## 2020-01-04 LAB — LIPASE, BLOOD: Lipase: 18 U/L (ref 11–51)

## 2020-01-04 LAB — SARS CORONAVIRUS 2 BY RT PCR (HOSPITAL ORDER, PERFORMED IN ~~LOC~~ HOSPITAL LAB): SARS Coronavirus 2: NEGATIVE

## 2020-01-04 MED ORDER — ONDANSETRON HCL 4 MG/2ML IJ SOLN
4.0000 mg | Freq: Four times a day (QID) | INTRAMUSCULAR | Status: DC | PRN
  Administered 2020-01-05 (×3): 4 mg via INTRAVENOUS
  Filled 2020-01-04 (×3): qty 2

## 2020-01-04 MED ORDER — PRENATAL PLUS 27-1 MG PO TABS
1.0000 | ORAL_TABLET | Freq: Every day | ORAL | Status: DC
  Filled 2020-01-04 (×3): qty 1

## 2020-01-04 MED ORDER — SODIUM CHLORIDE 0.9 % IV SOLN
1000.0000 mL | Freq: Once | INTRAVENOUS | Status: AC
  Administered 2020-01-04: 1000 mL via INTRAVENOUS

## 2020-01-04 MED ORDER — POTASSIUM CHLORIDE IN NACL 40-0.9 MEQ/L-% IV SOLN
INTRAVENOUS | Status: DC
  Filled 2020-01-04 (×4): qty 1000

## 2020-01-04 MED ORDER — SODIUM CHLORIDE 0.9 % IV SOLN
1.0000 g | Freq: Once | INTRAVENOUS | Status: AC
  Administered 2020-01-04: 1 g via INTRAVENOUS
  Filled 2020-01-04: qty 10

## 2020-01-04 MED ORDER — ONDANSETRON HCL 4 MG/2ML IJ SOLN
4.0000 mg | Freq: Once | INTRAMUSCULAR | Status: AC
  Administered 2020-01-04: 4 mg via INTRAVENOUS
  Filled 2020-01-04: qty 2

## 2020-01-04 MED ORDER — ACETAMINOPHEN 325 MG PO TABS
650.0000 mg | ORAL_TABLET | ORAL | Status: DC | PRN
  Administered 2020-01-04 – 2020-01-05 (×3): 650 mg via ORAL
  Filled 2020-01-04 (×3): qty 2

## 2020-01-04 MED ORDER — MORPHINE SULFATE (PF) 2 MG/ML IV SOLN
2.0000 mg | Freq: Once | INTRAVENOUS | Status: AC
  Administered 2020-01-04: 2 mg via INTRAVENOUS
  Filled 2020-01-04: qty 1

## 2020-01-04 NOTE — ED Triage Notes (Signed)
Pt reports NV and abd pain for 4 days. Pt reports has stabbing pain and pain is across her abd. Pt also reports intermittent NV. Pt states only able to drink fluids.

## 2020-01-04 NOTE — ED Provider Notes (Signed)
[facility] Emergency Department Provider Note   ____________________________________________    I have reviewed the triage vital signs and the nursing notes.   HISTORY  Chief Complaint Abdominal Pain, Nausea, and Emesis     HPI Stephanie Navarro is a 24 y.o. female with past medical history as detailed below who presents today with complaints of abdominal pain, nausea, vomiting.  She does report dysuria.  She reports her symptoms have been ongoing for 3 days.  She has had chills and thinks she had fever last night.  Has not been tolerating p.o.'s.  Has tried over-the-counter medications with little improvement.  No sick contacts reported.  She reports her last period was in early June but she did not have one in July, she is suspicious that she may be pregnant.  No vaginal bleeding  Past Medical History:  Diagnosis Date  . Acute appendicitis with localized peritonitis 05/21/2017  . Encounter for procreative genetic counseling 06/16/2018   Formatting of this note might be different from the original. Genetic counseling visit on 06/24/2018  Aneuploidy screening/ testing:  Genetic counseling visit pending  Carrier screening:  Genetic counseling visit pending   WGS study eligible: no  GENETIC COUNSELING PREVISIT SUMMARY Referring provider: [facility]  Indication: FTS Other relevant history:  Insurance: [id number] P - (Medicaid)  M    Patient Active Problem List   Diagnosis Date Noted  . Acute pyelonephritis 01/04/2020  . Encounter for elective induction of labor 01/14/2019  . Chlamydia infection affecting pregnancy in third trimester, antepartum 01/14/2019  . Supervision of other normal pregnancy, antepartum 08/19/2018    Past Surgical History:  Procedure Laterality Date  . APPENDECTOMY  05/21/2017   Dr. Excell Seltzer   . LAPAROSCOPIC APPENDECTOMY N/A 05/21/2017   Procedure: APPENDECTOMY LAPAROSCOPIC;  Surgeon: Lattie Haw, MD;  Location: [facility] ORS;   Service: General;  Laterality: N/A;    Prior to Admission medications   Not on File     Allergies Patient has no known allergies.  Family History  Problem Relation Age of Onset  . Cancer Neg Hx   . Hypertension Neg Hx   . Diabetes Neg Hx   . Heart disease Neg Hx     Social History Social History   Tobacco Use  . Smoking status: Current Every Day Smoker    Packs/day: 0.50    Types: Cigarettes  . Smokeless tobacco: Never Used  Vaping Use  . Vaping Use: Never used  Substance Use Topics  . Alcohol use: No  . Drug use: No    Review of Systems  Constitutional: No fever/chills Eyes: No visual changes.  ENT: No sore throat. Cardiovascular: Denies chest pain. Respiratory: Denies shortness of breath. Gastrointestinal: As above Genitourinary: As above Musculoskeletal: Negative for back pain. Skin: Negative for rash. Neurological: Negative for headaches    ____________________________________________   PHYSICAL EXAM:  VITAL SIGNS: ED Triage Vitals  Enc Vitals Group     BP 01/04/20 1103 110/67     Pulse Rate 01/04/20 1103 (!) 136     Resp 01/04/20 1103 17     Temp 01/04/20 1103 98.7 F (37.1 C)     Temp Source 01/04/20 1103 Oral     SpO2 01/04/20 1103 97 %     Weight 01/04/20 1042 72.6 kg (160 lb)     Height 01/04/20 1042 1.626 m (5\' 4" )     Head Circumference --      Peak Flow --  Pain Score 01/04/20 1042 10     Pain Loc --      Pain Edu? --      Excl. in GC? --     Constitutional: Alert and oriented.  Eyes: Conjunctivae are normal.   Nose: No congestion/rhinnorhea. Mouth/Throat: Mucous membranes are moist.    Cardiovascular: Tachycardia, regular rhythm. Grossly normal heart sounds.  Good peripheral circulation. Respiratory: Normal respiratory effort.  No retractions. Lungs CTAB. Gastrointestinal: Mild tenderness diffusely, no distention ,  No CVA tenderness. Genitourinary: deferred Musculoskeletal:   Warm and well perfused Neurologic:   Normal speech and language. No gross focal neurologic deficits are appreciated.  Skin:  Skin is warm, dry and intact. No rash noted. Psychiatric: Mood and affect are normal. Speech and behavior are normal.  ____________________________________________   LABS (all labs ordered are listed, but only abnormal results are displayed)  Labs Reviewed  COMPREHENSIVE METABOLIC PANEL - Abnormal; Notable for the following components:      Result Value   Sodium 130 (*)    Potassium 2.8 (*)    CO2 18 (*)    Glucose, Bld 209 (*)    Creatinine, Ser 1.19 (*)    Calcium 8.8 (*)    All other components within normal limits  CBC - Abnormal; Notable for the following components:   WBC 22.7 (*)    All other components within normal limits  URINALYSIS, COMPLETE (UACMP) WITH MICROSCOPIC - Abnormal; Notable for the following components:   Color, Urine AMBER (*)    APPearance TURBID (*)    Glucose, UA >=500 (*)    Hgb urine dipstick MODERATE (*)    Ketones, ur 5 (*)    Protein, ur >=300 (*)    Nitrite POSITIVE (*)    Leukocytes,Ua LARGE (*)    WBC, UA >50 (*)    Bacteria, UA RARE (*)    All other components within normal limits  POCT PREGNANCY, URINE - Abnormal; Notable for the following components:   Preg Test, Ur POSITIVE (*)    All other components within normal limits  URINE CULTURE  SARS CORONAVIRUS 2 BY RT PCR (HOSPITAL ORDER, PERFORMED IN [facility])  LIPASE, BLOOD  POC URINE PREG, ED   ____________________________________________  EKG  None ____________________________________________  RADIOLOGY  Right upper quadrant ultrasound negative for cholelithiasis ____________________________________________   PROCEDURES  Procedure(s) performed: No  Procedures   Critical Care performed: No ____________________________________________   INITIAL IMPRESSION / ASSESSMENT AND PLAN / ED COURSE  Pertinent labs & imaging results that were available during my care of the  patient were reviewed by me and considered in my medical decision making (see chart for details).  Patient presents with abdominal pain which is fairly diffuse as described above.  Positive nausea vomiting, chills, possible fever, some dysuria.  Differential includes urinary tract infection, pyelonephritis, gastroenteritis, colitis, less likely COVID-19.  Lab work is notable for white blood cell count of 22.7, mild hyponatremia and hypokalemia.  Positive pregnancy test, last menstrual period early June, likely less than 8 weeks  Ultrasound demonstrates normal gallbladder.  Suspect pyelonephritis as the cause given her urinalysis  Patient treated with IV morphine, IV Zofran, IV Rocephin  She will require admission to the hospital service for IV antibiotic    ____________________________________________   FINAL CLINICAL IMPRESSION(S) / ED DIAGNOSES  Final diagnoses:  Upper abdominal pain  Pyelonephritis  Less than [redacted] weeks gestation of pregnancy        Note:  This document was prepared  using Conservation officer, historic buildings and may include unintentional dictation errors.   Jene Every, MD 01/04/20 [phone]

## 2020-01-04 NOTE — H&P (Signed)
History and Physical    Stephanie Navarro VHQ:[id number] DOB: 04-13-1996 DOA: 01/04/2020  PCP: [facility]   Patient coming from: Home  I have personally briefly reviewed patient's old medical records in [facility]  Chief Complaint: Abdominal pain  HPI: AUBRIONNA ISTRE is a 24 y.o. female with no significant medical history who presents to the emergency room for evaluation of a 3-day history of abdominal pain mostly periumbilical associated with nausea, vomiting and chills.  She has frequency of urination but denies having any dysuria.  Patient has not been able to tolerate any oral intake.  Patient's last menstrual period was in June and a pregnancy test was done in the emergency room which came back positive. She denies having any chest pain, no shortness of breath, no changes in her bowel habits, no dizziness, no lightheadedness, no headaches. Labs show a potassium of 2.8, sodium of 130, white count of 22.7 and pyuria    ED Course: Patient is a 24 year old female who presents to the emergency room for evaluation of abdominal pain mostly periumbilical associated with chills, nausea, frequency of urination and emesis.  She has a white cell count of 22,000, sodium of 130 and potassium of 2.8 and pyuria.  She will be admitted to the hospital for further evaluation  Review of Systems: As per HPI otherwise 10 point review of systems negative.    Past Medical History:  Diagnosis Date  . Acute appendicitis with localized peritonitis 05/21/2017  . Encounter for procreative genetic counseling 06/16/2018   Formatting of this note might be different from the original. Genetic counseling visit on 06/24/2018  Aneuploidy screening/ testing:  Genetic counseling visit pending  Carrier screening:  Genetic counseling visit pending   WGS study eligible: no  GENETIC COUNSELING PREVISIT SUMMARY Referring provider: [facility]  Indication: FTS Other relevant history:   Insurance: [id number] P - (Medicaid)  M    Past Surgical History:  Procedure Laterality Date  . APPENDECTOMY  05/21/2017   Dr. Excell Seltzer   . LAPAROSCOPIC APPENDECTOMY N/A 05/21/2017   Procedure: APPENDECTOMY LAPAROSCOPIC;  Surgeon: Lattie Haw, MD;  Location: [facility] ORS;  Service: General;  Laterality: N/A;     reports that she has been smoking cigarettes. She has been smoking about 0.50 packs per day. She has never used smokeless tobacco. She reports that she does not drink alcohol and does not use drugs.  No Known Allergies  Family History  Problem Relation Age of Onset  . Cancer Neg Hx   . Hypertension Neg Hx   . Diabetes Neg Hx   . Heart disease Neg Hx      Prior to Admission medications   Not on File    Physical Exam: Vitals:   01/04/20 1103 01/04/20 1214 01/04/20 1342 01/04/20 1346  BP: 110/67 (!) 110/56    Pulse: (!) 136 100    Resp: 17 17 21    Temp: 98.7 F (37.1 C)   98.5 F (36.9 C)  TempSrc: Oral   Oral  SpO2: 97% 97%    Weight:      Height:         Vitals:   01/04/20 1103 01/04/20 1214 01/04/20 1342 01/04/20 1346  BP: 110/67 (!) 110/56    Pulse: (!) 136 100    Resp: 17 17 21    Temp: 98.7 F (37.1 C)   98.5 F (36.9 C)  TempSrc: Oral   Oral  SpO2: 97% 97%    Weight:  Height:        Constitutional: NAD, alert and oriented x 3.  Acutely ill-appearing Eyes: PERRL, lids and conjunctivae normal responsive ENMT: Mucous membranes are moist.  Neck: normal, supple, no masses, no thyromegaly Respiratory: clear to auscultation bilaterally, no wheezing, no crackles. Normal respiratory effort. No accessory muscle use.  Cardiovascular: Tachycardia, no murmurs / rubs / gallops. No extremity edema. 2+ pedal pulses. No carotid bruits.  Abdomen: Periumbilical tenderness, no masses palpated. No hepatosplenomegaly. Bowel sounds positive.  Musculoskeletal: no clubbing / cyanosis. No joint deformity upper and lower extremities.  Skin: no rashes, lesions,  ulcers.  Neurologic: No gross focal neurologic deficit. Psychiatric: Normal mood and affect.   Labs on Admission: I have personally reviewed following labs and imaging studies  CBC: Recent Labs  Lab 01/04/20 1104  WBC 22.7*  HGB 13.0  HCT 38.3  MCV 88.7  PLT 151   Basic Metabolic Panel: Recent Labs  Lab 01/04/20 1104  NA 130*  K 2.8*  CL 99  CO2 18*  GLUCOSE 209*  BUN 12  CREATININE 1.19*  CALCIUM 8.8*   GFR: Estimated Creatinine Clearance: 72.5 mL/min (A) (by C-G formula based on SCr of 1.19 mg/dL (H)). Liver Function Tests: Recent Labs  Lab 01/04/20 1104  AST 22  ALT 16  ALKPHOS 78  BILITOT 0.9  PROT 7.9  ALBUMIN 3.7   Recent Labs  Lab 01/04/20 1104  LIPASE 18   No results for input(s): AMMONIA in the last 168 hours. Coagulation Profile: No results for input(s): INR, PROTIME in the last 168 hours. Cardiac Enzymes: No results for input(s): CKTOTAL, CKMB, CKMBINDEX, TROPONINI in the last 168 hours. BNP (last 3 results) No results for input(s): PROBNP in the last 8760 hours. HbA1C: No results for input(s): HGBA1C in the last 72 hours. CBG: No results for input(s): GLUCAP in the last 168 hours. Lipid Profile: No results for input(s): CHOL, HDL, LDLCALC, TRIG, CHOLHDL, LDLDIRECT in the last 72 hours. Thyroid Function Tests: No results for input(s): TSH, T4TOTAL, FREET4, T3FREE, THYROIDAB in the last 72 hours. Anemia Panel: No results for input(s): VITAMINB12, FOLATE, FERRITIN, TIBC, IRON, RETICCTPCT in the last 72 hours. Urine analysis:    Component Value Date/Time   COLORURINE AMBER (A) 01/04/2020 1040   APPEARANCEUR TURBID (A) 01/04/2020 1040   LABSPEC 1.014 01/04/2020 1040   PHURINE 5.0 01/04/2020 1040   GLUCOSEU >=500 (A) 01/04/2020 1040   HGBUR MODERATE (A) 01/04/2020 1040   BILIRUBINUR NEGATIVE 01/04/2020 1040   KETONESUR 5 (A) 01/04/2020 1040   PROTEINUR >=300 (A) 01/04/2020 1040   NITRITE POSITIVE (A) 01/04/2020 1040   LEUKOCYTESUR  LARGE (A) 01/04/2020 1040    Radiological Exams on Admission: US Abdomen Limited RUQ  Result Date: 01/04/2020 CLINICAL DATA:  Upper abdominal pain for 4 days EXAM: ULTRASOUND ABDOMEN LIMITED RIGHT UPPER QUADRANT COMPARISON:  CT 05/21/2017 FINDINGS: Gallbladder: No gallstones or wall thickening visualized. No sonographic Murphy sign noted by sonographer. Common bile duct: Diameter: 2 mm. Liver: No focal lesion identified. Within normal limits in parenchymal echogenicity. Portal vein is patent on color Doppler imaging with normal direction of blood flow towards the liver. Other: None. IMPRESSION: Normal right upper quadrant ultrasound. Electronically Signed   By: Duanne Guess D.O.   On: 01/04/2020 12:38    EKG: Independently reviewed.   Assessment/Plan Principal Problem:   Acute pyelonephritis Active Problems:   Hypokalemia   Hyponatremia   IUP (intrauterine pregnancy), incidental    Acute pyelonephritis Patient presents for evaluation of  abdominal pain mostly periumbilical associated with nausea, vomiting, chills and has a white cell count of 22,000. She also has pyuria We will treat patient empirically with IV Rocephin IV fluid resuscitation Follow-up results of blood cultures   Hypokalemia/hyponatremia Secondary to GI loss from nausea and vomiting Supplement potassium   Intrauterine pregnancy Patient is probably about [redacted] weeks pregnant and will need to follow-up with GYN as an outpatient    DVT prophylaxis: SCD Code Status: Full code Family Communication: Greater than 50% of time was spent discussing plan of care with patient at the bedside.  She verbalizes understanding and agrees with the plan. Disposition Plan: Back to previous home environment Consults called: None    Jaykub Mackins MD [facility]     01/04/2020, 2:33 PM

## 2020-01-05 ENCOUNTER — Other Ambulatory Visit: Payer: Self-pay

## 2020-01-05 DIAGNOSIS — N10 Acute tubulo-interstitial nephritis: Secondary | ICD-10-CM | POA: Diagnosis not present

## 2020-01-05 DIAGNOSIS — Z62810 Personal history of physical and sexual abuse in childhood: Secondary | ICD-10-CM

## 2020-01-05 LAB — BASIC METABOLIC PANEL
Anion gap: 8 (ref 5–15)
BUN: 8 mg/dL (ref 6–20)
CO2: 19 mmol/L — ABNORMAL LOW (ref 22–32)
Calcium: 7.9 mg/dL — ABNORMAL LOW (ref 8.9–10.3)
Chloride: 105 mmol/L (ref 98–111)
Creatinine, Ser: 0.93 mg/dL (ref 0.44–1.00)
GFR calc Af Amer: >60 mL/min (ref >60)
GFR calc non Af Amer: >60 mL/min (ref >60)
Glucose, Bld: 111 mg/dL — ABNORMAL HIGH (ref 70–99)
Potassium: 3.9 mmol/L (ref 3.5–5.1)
Sodium: 132 mmol/L — ABNORMAL LOW (ref 135–145)

## 2020-01-05 LAB — CBC
HCT: 31.7 % — ABNORMAL LOW (ref 36.0–46.0)
Hemoglobin: 10.7 g/dL — ABNORMAL LOW (ref 12.0–15.0)
MCH: 30 pg (ref 26.0–34.0)
MCHC: 33.8 g/dL (ref 30.0–36.0)
MCV: 88.8 fL (ref 80.0–100.0)
Platelets: 125 10*3/uL — ABNORMAL LOW (ref 150–400)
RBC: 3.57 MIL/uL — ABNORMAL LOW (ref 3.87–5.11)
RDW: 13.4 % (ref 11.5–15.5)
WBC: 13.6 10*3/uL — ABNORMAL HIGH (ref 4.0–10.5)
nRBC: 0 % (ref 0.0–0.2)

## 2020-01-05 MED ORDER — MAGNESIUM SULFATE 2 GM/50ML IV SOLN
2.0000 g | Freq: Once | INTRAVENOUS | Status: AC
  Administered 2020-01-05: 2 g via INTRAVENOUS
  Filled 2020-01-05: qty 50

## 2020-01-05 MED ORDER — SODIUM CHLORIDE 0.9 % IV SOLN: INTRAVENOUS | Status: DC

## 2020-01-05 MED ORDER — HYDROCODONE-ACETAMINOPHEN 7.5-325 MG PO TABS
1.0000 | ORAL_TABLET | Freq: Four times a day (QID) | ORAL | Status: DC | PRN
  Administered 2020-01-05 – 2020-01-07 (×7): 1 via ORAL
  Filled 2020-01-05 (×15): qty 1

## 2020-01-05 MED ORDER — SODIUM CHLORIDE 0.9 % IV SOLN
1.0000 g | INTRAVENOUS | Status: DC
  Administered 2020-01-05 – 2020-01-07 (×3): 1 g via INTRAVENOUS
  Filled 2020-01-05: qty 1
  Filled 2020-01-05: qty 10
  Filled 2020-01-05 (×2): qty 1
  Filled 2020-01-05: qty 10

## 2020-01-05 NOTE — Progress Notes (Addendum)
NP Ouma notified of 4 beats of asymptomatic Vtach. No new orders, Patient remains on tele, Will continue with plan of care

## 2020-01-05 NOTE — Progress Notes (Signed)
Report called to Stephanie Navarro on Mother baby.

## 2020-01-05 NOTE — Progress Notes (Signed)
PROGRESS NOTE    Stephanie Navarro  VFI:[id number] DOB: May 30, 1996 DOA: 01/04/2020 PCP: [facility]    Brief Narrative:Stephanie Navarro is a 24 y.o. female with no significant medical history who presents to the emergency room for evaluation of a 3-day history of abdominal pain mostly periumbilical associated with nausea, vomiting and chills.  She has frequency of urination but denies having any dysuria.  Patient has not been able to tolerate any oral intake.  Patient's last menstrual period was in June and a pregnancy test was done in the emergency room which came back positive. She denies having any chest pain, no shortness of breath, no changes in her bowel habits, no dizziness, no lightheadedness, no headaches. Labs show a potassium of 2.8, sodium of 130, white count of 22.7 and pyuria   Assessment & Plan:   Principal Problem:   Acute pyelonephritis Active Problems:   Hypokalemia   Hyponatremia   IUP (intrauterine pregnancy), incidental   Hypomagnesemia    Acute pyelonephritis-patient admitted with abdominal pain nausea vomiting and chills.  She also reports pyuria.  On admission her white count was 22,000 she was empirically started on IV Rocephin.  UA positive for ketones large amount of leukocytes nitrites and proteins. WBC is down to 13.6 from 22.7.  Hyperglycemia check hemoglobin A1c   Hypokalemia/hyponatremia-sodium 132 improved with IV fluids.  Potassium normalized to 3.9.  Continue normal saline DC potassium.  Intrauterine pregnancy Patient is probably about [redacted] weeks pregnant and will need to follow-up with GYN as an outpatient    Estimated body mass index is 27.46 kg/m as calculated from the following:   Height as of this encounter: 5\' 4"  (1.626 m).   Weight as of this encounter: 72.6 kg.  DVT prophylaxis: SCD Code Status: Full code Family Communication: Boyfriend/husband in the room Disposition Plan: Pending clinical  improvement Consultants: none Patient admitted from home Plan discharge to home in a day or 2.  Needs to follow-up urine culture.   Procedures:none Antimicrobials:rocephin  Subjective: Complaining of severe right CVA tenderness and pain  Objective: Vitals:   01/05/20 0811 01/05/20 0815 01/05/20 1100 01/05/20 1300  BP: (!) 90/56     Pulse: 79     Resp: 16 (!) 24 22 (!) 25  Temp: 98.4 F (36.9 C)     TempSrc: Oral     SpO2: 99%     Weight:      Height:        Intake/Output Summary (Last 24 hours) at 01/05/2020 1555 Last data filed at 01/05/2020 1435 Gross per 24 hour  Intake 1273.4 ml  Output 1450 ml  Net -176.6 ml   Filed Weights   01/04/20 1042  Weight: 72.6 kg    Examination:  General exam: Appears calm and comfortable  Respiratory system: Clear to auscultation. Respiratory effort normal. Cardiovascular system: S1 & S2 heard, RRR. No JVD, murmurs, rubs, gallops or clicks. No pedal edema. Gastrointestinal system: Abdomen is nondistended, soft and nontender. No organomegaly or masses felt. Normal bowel sounds heard.  Right CVA tenderness Central nervous system: Alert and oriented. No focal neurological deficits. Extremities: Symmetric 5 x 5 power. Skin: No rashes, lesions or ulcers Psychiatry: Judgement and insight appear normal. Mood & affect appropriate.     Data Reviewed: I have personally reviewed following labs and imaging studies  CBC: Recent Labs  Lab 01/04/20 1104 01/05/20 0519  WBC 22.7* 13.6*  HGB 13.0 10.7*  HCT 38.3 31.7*  MCV 88.7 88.8  PLT 151 125*   Basic Metabolic Panel: Recent Labs  Lab 01/04/20 1104 01/05/20 0519  NA 130* 132*  K 2.8* 3.9  CL 99 105  CO2 18* 19*  GLUCOSE 209* 111*  BUN 12 8  CREATININE 1.19* 0.93  CALCIUM 8.8* 7.9*  MG 1.5*  --    GFR: Estimated Creatinine Clearance: 92.7 mL/min (by C-G formula based on SCr of 0.93 mg/dL). Liver Function Tests: Recent Labs  Lab 01/04/20 1104  AST 22  ALT 16  ALKPHOS  78  BILITOT 0.9  PROT 7.9  ALBUMIN 3.7   Recent Labs  Lab 01/04/20 1104  LIPASE 18   No results for input(s): AMMONIA in the last 168 hours. Coagulation Profile: No results for input(s): INR, PROTIME in the last 168 hours. Cardiac Enzymes: No results for input(s): CKTOTAL, CKMB, CKMBINDEX, TROPONINI in the last 168 hours. BNP (last 3 results) No results for input(s): PROBNP in the last 8760 hours. HbA1C: No results for input(s): HGBA1C in the last 72 hours. CBG: No results for input(s): GLUCAP in the last 168 hours. Lipid Profile: No results for input(s): CHOL, HDL, LDLCALC, TRIG, CHOLHDL, LDLDIRECT in the last 72 hours. Thyroid Function Tests: No results for input(s): TSH, T4TOTAL, FREET4, T3FREE, THYROIDAB in the last 72 hours. Anemia Panel: No results for input(s): VITAMINB12, FOLATE, FERRITIN, TIBC, IRON, RETICCTPCT in the last 72 hours. Sepsis Labs: No results for input(s): PROCALCITON, LATICACIDVEN in the last 168 hours.  Recent Results (from the past 240 hour(s))  SARS Coronavirus 2 by RT PCR (hospital order, performed in [facility] hospital lab) Nasopharyngeal Nasopharyngeal Swab     Status: None   Collection Time: 01/04/20 12:58 PM   Specimen: Nasopharyngeal Swab  Result Value Ref Range Status   SARS Coronavirus 2 NEGATIVE NEGATIVE Final    Comment: (NOTE) SARS-CoV-2 target nucleic acids are NOT DETECTED.  The SARS-CoV-2 RNA is generally detectable in upper and lower respiratory specimens during the acute phase of infection. The lowest concentration of SARS-CoV-2 viral copies this assay can detect is 250 copies / mL. A negative result does not preclude SARS-CoV-2 infection and should not be used as the sole basis for treatment or other patient management decisions.  A negative result may occur with improper specimen collection / handling, submission of specimen other than nasopharyngeal swab, presence of viral mutation(s) within the areas targeted by this  assay, and inadequate number of viral copies (<250 copies / mL). A negative result must be combined with clinical observations, patient history, and epidemiological information.  Fact Sheet for Patients:   BoilerBrush.com.cy  Fact Sheet for Healthcare Providers: https://pope.com/  This test is not yet approved or  cleared by the [place] FDA and has been authorized for detection and/or diagnosis of SARS-CoV-2 by FDA under an Emergency Use Authorization (EUA).  This EUA will remain in effect (meaning this test can be used) for the duration of the COVID-19 declaration under Section 564(b)(1) of the Act, 21 U.S.C. section 360bbb-3(b)(1), unless the authorization is terminated or revoked sooner.  Performed at [facility], [address], [place], [place] [address]          Radiology Studies: US Abdomen Limited RUQ  Result Date: 01/04/2020 CLINICAL DATA:  Upper abdominal pain for 4 days EXAM: ULTRASOUND ABDOMEN LIMITED RIGHT UPPER QUADRANT COMPARISON:  CT 05/21/2017 FINDINGS: Gallbladder: No gallstones or wall thickening visualized. No sonographic Murphy sign noted by sonographer. Common bile duct: Diameter: 2 mm. Liver: No focal lesion identified. Within normal  limits in parenchymal echogenicity. Portal vein is patent on color Doppler imaging with normal direction of blood flow towards the liver. Other: None. IMPRESSION: Normal right upper quadrant ultrasound. Electronically Signed   By: Duanne Guess D.O.   On: 01/04/2020 12:38        Scheduled Meds: . prenatal vitamin w/FE, FA  1 tablet Oral Daily   Continuous Infusions: . sodium chloride 75 mL/hr at 01/05/20 1246  . cefTRIAXone (ROCEPHIN)  IV 1 g (01/05/20 1328)     LOS: 1 day     Alwyn Ren, MD  01/05/2020, 3:55 PM

## 2020-01-06 ENCOUNTER — Encounter: Payer: Self-pay | Admitting: Internal Medicine

## 2020-01-06 ENCOUNTER — Ambulatory Visit: Payer: Medicaid Other

## 2020-01-06 ENCOUNTER — Inpatient Hospital Stay: Payer: Medicaid Other

## 2020-01-06 DIAGNOSIS — N10 Acute tubulo-interstitial nephritis: Secondary | ICD-10-CM | POA: Diagnosis not present

## 2020-01-06 LAB — COMPREHENSIVE METABOLIC PANEL
ALT: 23 U/L (ref 0–44)
AST: 27 U/L (ref 15–41)
Albumin: 2.9 g/dL — ABNORMAL LOW (ref 3.5–5.0)
Alkaline Phosphatase: 65 U/L (ref 38–126)
Anion gap: 7 (ref 5–15)
BUN: 6 mg/dL (ref 6–20)
CO2: 21 mmol/L — ABNORMAL LOW (ref 22–32)
Calcium: 8 mg/dL — ABNORMAL LOW (ref 8.9–10.3)
Chloride: 106 mmol/L (ref 98–111)
Creatinine, Ser: 0.72 mg/dL (ref 0.44–1.00)
GFR calc Af Amer: >60 mL/min (ref >60)
GFR calc non Af Amer: >60 mL/min (ref >60)
Glucose, Bld: 93 mg/dL (ref 70–99)
Potassium: 3.9 mmol/L (ref 3.5–5.1)
Sodium: 134 mmol/L — ABNORMAL LOW (ref 135–145)
Total Bilirubin: 0.6 mg/dL (ref 0.3–1.2)
Total Protein: 6.4 g/dL — ABNORMAL LOW (ref 6.5–8.1)

## 2020-01-06 LAB — CBC
HCT: 32.8 % — ABNORMAL LOW (ref 36.0–46.0)
Hemoglobin: 11.4 g/dL — ABNORMAL LOW (ref 12.0–15.0)
MCH: 30.7 pg (ref 26.0–34.0)
MCHC: 34.8 g/dL (ref 30.0–36.0)
MCV: 88.4 fL (ref 80.0–100.0)
Platelets: 144 10*3/uL — ABNORMAL LOW (ref 150–400)
RBC: 3.71 MIL/uL — ABNORMAL LOW (ref 3.87–5.11)
RDW: 14.2 % (ref 11.5–15.5)
WBC: 8.3 10*3/uL (ref 4.0–10.5)
nRBC: 0 % (ref 0.0–0.2)

## 2020-01-06 LAB — HEMOGLOBIN A1C
Hgb A1c MFr Bld: 5.1 % (ref 4.8–5.6)
Mean Plasma Glucose: 99.67 mg/dL

## 2020-01-06 MED ORDER — MAGNESIUM SULFATE 2 GM/50ML IV SOLN
2.0000 g | Freq: Once | INTRAVENOUS | Status: AC
  Administered 2020-01-06: 2 g via INTRAVENOUS
  Filled 2020-01-06: qty 50

## 2020-01-06 NOTE — Progress Notes (Signed)
PROGRESS NOTE    AI SONNENFELD  OXB:[id number] DOB: [birth year] DOA: 01/04/2020 PCP: [facility]    Brief Narrative:Stephanie Navarro is a 24 y.o. female with no significant medical history who presents to the emergency room for evaluation of a 3-day history of abdominal pain mostly periumbilical associated with nausea, vomiting and chills.  She has frequency of urination but denies having any dysuria.  Patient has not been able to tolerate any oral intake.  Patient's last menstrual period was in June and a pregnancy test was done in the emergency room which came back positive. She denies having any chest pain, no shortness of breath, no changes in her bowel habits, no dizziness, no lightheadedness, no headaches. Labs show a potassium of 2.8, sodium of 130, white count of 22.7 and pyuria   Assessment & Plan:   Principal Problem:   Acute pyelonephritis Active Problems:   Hypokalemia   Hyponatremia   IUP (intrauterine pregnancy), incidental   Hypomagnesemia    Acute pyelonephritis-patient admitted with abdominal pain nausea vomiting and chills.  She also reports pyuria.  On admission her white count was 22,000 she was empirically started on IV Rocephin.  UA positive for ketones large amount of leukocytes nitrites and proteins. WBC is down to 8.3 from 13.6 from 22.7. Out of bed ambulate T-max 99.9 Continue Rocephin Urine culture more than 100,000 colonies of gram-negative rods  Hyperglycemia check hemoglobin A1c   Hypokalemia/hyponatremia-sodium 134 improved with IV fluids.  Potassium normalized to 3.9.  Continue normal saline DC potassium.  Intrauterine pregnancy Patient is probably about [redacted] weeks pregnant and will need to follow-up with GYN as an outpatient  Dizziness with headache will check CT without contrast    Estimated body mass index is 27.46 kg/m as calculated from the following:   Height as of this encounter: 5\' 4"  (1.626 m).   Weight  as of this encounter: 72.6 kg.  DVT prophylaxis: SCD Code Status: Full code Family Communication: Boyfriend/husband in the room Disposition Plan: Pending clinical improvement Consultants: none Patient admitted from home Plan discharge to home in a day or 2.  Needs to follow-up urine culture.   Procedures:none Antimicrobials:rocephin  Subjective: Complaining of severe right CVA tenderness and pain Complains of dizziness and headache especially with walking  Objective: Vitals:   01/06/20 0500 01/06/20 0700 01/06/20 0809 01/06/20 0927  BP:   (!) 107/57   Pulse:   80   Resp: 19 21 18    Temp:   99.9 F (37.7 C) 98.9 F (37.2 C)  TempSrc:   Oral Oral  SpO2:   100%   Weight:      Height:        Intake/Output Summary (Last 24 hours) at 01/06/2020 1045 Last data filed at 01/06/2020 0820 Gross per 24 hour  Intake 2194.31 ml  Output 2200 ml  Net -5.69 ml   Filed Weights   01/04/20 1042  Weight: 72.6 kg    Examination:  General exam: Appears calm and comfortable  Respiratory system: Clear to auscultation. Respiratory effort normal. Cardiovascular system: S1 & S2 heard, RRR. No JVD, murmurs, rubs, gallops or clicks. No pedal edema. Gastrointestinal system: Abdomen is nondistended, soft and nontender. No organomegaly or masses felt. Normal bowel sounds heard.  Right CVA tenderness Central nervous system: Alert and oriented. No focal neurological deficits. Extremities: Symmetric 5 x 5 power. Skin: No rashes, lesions or ulcers Psychiatry: Judgement and insight appear normal. Mood & affect appropriate.     Data  Reviewed: I have personally reviewed following labs and imaging studies  CBC: Recent Labs  Lab 01/04/20 1104 01/05/20 0519 01/06/20 0544  WBC 22.7* 13.6* 8.3  HGB 13.0 10.7* 11.4*  HCT 38.3 31.7* 32.8*  MCV 88.7 88.8 88.4  PLT 151 125* 144*   Basic Metabolic Panel: Recent Labs  Lab 01/04/20 1104 01/05/20 0519 01/06/20 0544  NA 130* 132* 134*  K 2.8*  3.9 3.9  CL 99 105 106  CO2 18* 19* 21*  GLUCOSE 209* 111* 93  BUN 12 8 6   CREATININE 1.19* 0.93 0.72  CALCIUM 8.8* 7.9* 8.0*  MG 1.5*  --   --    GFR: Estimated Creatinine Clearance: 107.8 mL/min (by C-G formula based on SCr of 0.72 mg/dL). Liver Function Tests: Recent Labs  Lab 01/04/20 1104 01/06/20 0544  AST 22 27  ALT 16 23  ALKPHOS 78 65  BILITOT 0.9 0.6  PROT 7.9 6.4*  ALBUMIN 3.7 2.9*   Recent Labs  Lab 01/04/20 1104  LIPASE 18   No results for input(s): AMMONIA in the last 168 hours. Coagulation Profile: No results for input(s): INR, PROTIME in the last 168 hours. Cardiac Enzymes: No results for input(s): CKTOTAL, CKMB, CKMBINDEX, TROPONINI in the last 168 hours. BNP (last 3 results) No results for input(s): PROBNP in the last 8760 hours. HbA1C: Recent Labs    01/06/20 0544  HGBA1C 5.1   CBG: No results for input(s): GLUCAP in the last 168 hours. Lipid Profile: No results for input(s): CHOL, HDL, LDLCALC, TRIG, CHOLHDL, LDLDIRECT in the last 72 hours. Thyroid Function Tests: No results for input(s): TSH, T4TOTAL, FREET4, T3FREE, THYROIDAB in the last 72 hours. Anemia Panel: No results for input(s): VITAMINB12, FOLATE, FERRITIN, TIBC, IRON, RETICCTPCT in the last 72 hours. Sepsis Labs: No results for input(s): PROCALCITON, LATICACIDVEN in the last 168 hours.  Recent Results (from the past 240 hour(s))  Urine culture     Status: Abnormal (Preliminary result)   Collection Time: 01/04/20 10:40 AM   Specimen: Urine, Random  Result Value Ref Range Status   Specimen Description   Final    URINE, RANDOM Performed at [facility], [address], [place], [place]    Special Requests   Final    NONE Performed at [facility], [address], [place], [place]    Culture >=100,000 COLONIES/mL GRAM NEGATIVE RODS (A)  Final   Report Status PENDING  Incomplete  SARS Coronavirus 2 by RT PCR (hospital order, performed  in [facility] hospital lab) Nasopharyngeal Nasopharyngeal Swab     Status: None   Collection Time: 01/04/20 12:58 PM   Specimen: Nasopharyngeal Swab  Result Value Ref Range Status   SARS Coronavirus 2 NEGATIVE NEGATIVE Final    Comment: (NOTE) SARS-CoV-2 target nucleic acids are NOT DETECTED.  The SARS-CoV-2 RNA is generally detectable in upper and lower respiratory specimens during the acute phase of infection. The lowest concentration of SARS-CoV-2 viral copies this assay can detect is 250 copies / mL. A negative result does not preclude SARS-CoV-2 infection and should not be used as the sole basis for treatment or other patient management decisions.  A negative result may occur with improper specimen collection / handling, submission of specimen other than nasopharyngeal swab, presence of viral mutation(s) within the areas targeted by this assay, and inadequate number of viral copies (<250 copies / mL). A negative result must be combined with clinical observations, patient history, and epidemiological information.  Fact Sheet  for Patients:   BoilerBrush.com.cy  Fact Sheet for Healthcare Providers: https://pope.com/  This test is not yet approved or  cleared by the [place] FDA and has been authorized for detection and/or diagnosis of SARS-CoV-2 by FDA under an Emergency Use Authorization (EUA).  This EUA will remain in effect (meaning this test can be used) for the duration of the COVID-19 declaration under Section 564(b)(1) of the Act, 21 U.S.C. section 360bbb-3(b)(1), unless the authorization is terminated or revoked sooner.  Performed at [facility], [address] [place], [place] [address]          Radiology Studies: US Abdomen Limited RUQ  Result Date: 01/04/2020 CLINICAL DATA:  Upper abdominal pain for 4 days EXAM: ULTRASOUND ABDOMEN LIMITED RIGHT UPPER QUADRANT COMPARISON:  CT 05/21/2017  FINDINGS: Gallbladder: No gallstones or wall thickening visualized. No sonographic Murphy sign noted by sonographer. Common bile duct: Diameter: 2 mm. Liver: No focal lesion identified. Within normal limits in parenchymal echogenicity. Portal vein is patent on color Doppler imaging with normal direction of blood flow towards the liver. Other: None. IMPRESSION: Normal right upper quadrant ultrasound. Electronically Signed   By: Duanne Guess D.O.   On: 01/04/2020 12:38        Scheduled Meds: . prenatal vitamin w/FE, FA  1 tablet Oral Daily   Continuous Infusions: . sodium chloride 100 mL/hr at 01/06/20 0922  . cefTRIAXone (ROCEPHIN)  IV Stopped (01/05/20 1359)     LOS: 1 day     Alwyn Ren, MD  01/06/2020, 10:45 AM

## 2020-01-07 ENCOUNTER — Encounter: Payer: Self-pay | Admitting: Internal Medicine

## 2020-01-07 DIAGNOSIS — N10 Acute tubulo-interstitial nephritis: Secondary | ICD-10-CM | POA: Diagnosis not present

## 2020-01-07 MED ORDER — PRENATAL PLUS 27-1 MG PO TABS: 1.0000 | ORAL_TABLET | Freq: Every day | ORAL | 0 refills | Status: AC

## 2020-01-07 MED ORDER — CEPHALEXIN 500 MG PO CAPS: 500.0000 mg | ORAL_CAPSULE | Freq: Four times a day (QID) | ORAL | 0 refills | Status: AC

## 2020-01-07 NOTE — Discharge Instructions (Signed)
Pyelonephritis During Pregnancy ° °What are the causes? °This condition is caused by a bacterial infection in the lower urinary tract that spreads to the kidney. °What increases the risk? °You are more likely to develop this condition if: °· You have diabetes. °· You have a history of frequent urinary tract infections. °What are the signs or symptoms? °Symptoms of this condition may begin with symptoms of a lower urinary tract infection. These may include: °· A frequent urge to pass urine. °· Burning pain when passing urine. °· Pain and pressure in your lower abdomen. °· Blood in your urine. °· Cloudy or smelly urine. °As the infection spreads to your kidney, you may have these symptoms: °· Fever. °· Chills. °· Pain and tenderness in your upper abdomen or in your back and sides (flank pain). Flank pain often affects one side of the body, usually the right side. °· Nausea, vomiting, or loss of appetite. °How is this diagnosed? °This condition may be diagnosed based on: °· Your symptoms and medical history. °· A physical exam. °· Tests to confirm the diagnosis. These may include: °? Blood tests to check kidney function and look for signs of infection. °? Urine tests to check for signs of infection, including bacteria, white or red blood cells, and protein. °? Tests to grow and identify the type of bacteria that is causing the infection (urine culture). °? Imaging studies of your kidneys to learn more about your condition. °How is this treated? °This condition is treated in the hospital with antibiotics that are given into one of your veins through an IV. Your health care provider: °· Will start you on an antibiotic that is effective against common urinary tract infections. °· May switch to another antibiotic if the results of your urine culture show that your infection is caused by different bacteria. °· Will be careful to choose antibiotics that are the safest during pregnancy. °Other treatments may include: °· IV  fluids if you are nauseous and not able to drink fluids. °· Pain and fever medicines. °· Medicines for nausea and vomiting. °You will be able to go home when your infection is under control. To prevent another infection, you may need to continue taking antibiotics by mouth until your baby is born. °Follow these instructions at home: °Medicines ° °· Take over-the-counter and prescription medicines only as told by your health care provider. °· Take your antibiotic medicine as told by your health care provider. Do not stop taking the antibiotic even if you start to feel better. °· Continue to take your prenatal vitamins. °Lifestyle ° °· Follow instructions from your health care provider about eating or drinking restrictions. You may need to avoid: °? Foods and drinks with added sugar. °? Caffeine and fruit juice. °· Drink enough fluid to keep your urine pale yellow. °· Go to the bathroom frequently. Do not hold your urine. Try to empty your bladder completely. °· Change your underwear every day. Wear all-cotton underwear. Do not wear tight underwear or pants. °General instructions ° °· Take these steps to lower the risk of bacteria getting into your urinary tract: °? Use liquid soap instead of bar soap when showering or bathing. Bacteria can grow on bar soap. °? When you wash yourself, clean the urethra opening first. Use a washcloth to clean the area between your vagina and anus. Pat the area dry with a clean towel. °? Wash your hands before and after you go to the bathroom. °? Wipe yourself from front to back   after going to the bathroom. °? Do not use douches, perfumed soap, creams, or powders. °? Do not soak in a bath for more than 30 minutes. °· Return to your normal activities as told by your health care provider. Ask your health care provider what activities are safe for you. °· Keep all follow-up visits as told by your health care provider. This is important. °Contact a health care provider if: °· You have  chills or a fever. °· You have any symptoms of infection that do not get better at home. °· Symptoms of infection come back. °· You have a reaction or side effects from your antibiotic. °Get help right away if: °· You start having contractions. °Summary °· Pyelonephritis is an infection of the kidney or kidneys. °· This condition results when a bacterial infection in the lower urinary tract spreads to the kidney. °· Lower urinary tract infections are common during pregnancy. °· Pyelonephritis causes chills, a fever, flank pain, and nausea. °· Pyelonephritis is a serious infection that is usually treated in the hospital with IV antibiotics. °This information is not intended to replace advice given to you by your health care provider. Make sure you discuss any questions you have with your health care provider. °Document Revised: 09/18/2018 Document Reviewed: 08/28/2017 °Elsevier Patient Education © 2020 [employer] ° °

## 2020-01-07 NOTE — Discharge Summary (Signed)
Physician Discharge Summary  Stephanie Navarro OZH:[id number] DOB: 07/26/1995 DOA: 01/04/2020  PCP: [facility]  Admit date: 01/04/2020 Discharge date: 01/07/2020  Admitted From: home Disposition:home Recommendations for Outpatient Follow-up:  1. Follow up with PCP in 1-2 weeks 2. Please obtain BMP/CBC in one week 3. Please follow up with OB  4. Follow up with neurology   Home Health:none Equipment/Devicesnone  Discharge Condition:stable CODE STATUS full Diet recommendation: cardiac Brief/Interim Summary:Stephanie L Richmondis a 24 y.o.femalewithno significantmedical history who presents to the emergency room for evaluation of a 3-day history of abdominal pain mostly periumbilical associated with nausea, vomiting and chills. She has frequency of urination but denies having any dysuria. Patient has not been able to tolerate any oral intake. Patient's last menstrual period was in June and a pregnancy test was done in the emergency room which came back positive. She denies having any chest pain, no shortness of breath, no changes in her bowel habits, no dizziness, no lightheadedness, no headaches. Labs show a potassium of 2.8, sodium of 130, white count of 22.7 and pyuria  Discharge Diagnoses:  Principal Problem:   Acute pyelonephritis Active Problems:   Hypokalemia   Hyponatremia   IUP (intrauterine pregnancy), incidental   Hypomagnesemia      Acute pyelonephritis-patient admitted with abdominal pain nausea vomiting and chills.  She also reports pyuria.  On admission her white count was 22,000 she was empirically started on IV Rocephin.  UA positive for ketones large amount of leukocytes nitrites and proteins. WBC is down to 8.3 from 13.6 from 22.7. Urine culture more than 100,000 colonies of  ecoli  Will dc on keflex for 10 days   Hyperglycemia check hemoglobin A1c 5.1   Hypokalemia/hyponatremia-resolved  with replacement.  Sodium 134 improved with  IV fluids.  Potassium normalized to 3.9.  Continue normal saline DC potassium.  Intrauterine pregnancy Patient is probably about [redacted] weeks pregnant and will need to follow-up with GYNas an outpatient Patient is aware that she must follow-up with OB. She did tell me that she is going to follow-up with the OB she had in the prior two pregnancies.   Dizziness with headache checked CT head-partially empty sella turcica.  This can be associated with idiopathic intracranial hypertension.  Discussed with neurology gave her a dose of mag sulfate.  I have discussed with her to follow-up with neurology if symptoms persist.    Estimated body mass index is 27.46 kg/m as calculated from the following:   Height as of this encounter:  (1.626 m).   Weight as of this encounter: 72.6 kg.  Discharge Instructions  Discharge Instructions    Diet - low sodium heart healthy   Complete by: As directed    Increase activity slowly   Complete by: As directed      Allergies as of 01/07/2020   No Known Allergies     Medication List    TAKE these medications   cephALEXin 500 MG capsule Commonly known as: KEFLEX Take 1 capsule (500 mg total) by mouth 4 (four) times daily for 10 days.   prenatal vitamin w/FE, FA 27-1 MG Tabs tablet Take 1 tablet by mouth daily. Start taking on: January 08, 2020 What changed:   how much to take  when to take this       Follow-up Information    [facility] Follow up.   Specialty: General Practice Contact information: [address] [place][phone]  Pauletta BrownsZeylikman, Yuriy, MD Follow up.   Specialty: Neurology Why: Follow-up for intracranial hypertension Contact information: [address] [place] [id number] [phone]              No Known Allergies  Consultations:  None   Procedures/Studies: CT HEAD WO CONTRAST  Result Date: 01/06/2020 CLINICAL DATA:  Dizziness,  nonspecific. Additional provided by scanning: Headache for 4 days. EXAM: CT HEAD WITHOUT CONTRAST TECHNIQUE: Contiguous axial images were obtained from the base of the skull through the vertex without intravenous contrast. COMPARISON:  No pertinent prior exams are available for comparison. FINDINGS: Brain: Cerebral volume is normal. There is no acute intracranial hemorrhage. No demarcated cortical infarct. No extra-axial fluid collection. No evidence of intracranial mass. No midline shift. Partially empty sella turcica. Vascular: No hyperdense vessel. Skull: Normal. Negative for fracture or focal lesion. Sinuses/Orbits: Visualized orbits show no acute finding. No significant paranasal sinus disease or mastoid effusion at the imaged levels. IMPRESSION: No evidence of acute intracranial abnormality. Partially empty sella turcica. This finding is nonspecific, but can be associated with idiopathic intracranial hypertension. Otherwise unremarkable non-contrast CT appearance of the brain. Electronically Signed   By: Jackey LogeKyle  Golden DO   On: 01/06/2020 11:21   US Abdomen Limited RUQ  Result Date: 01/04/2020 CLINICAL DATA:  Upper abdominal pain for 4 days EXAM: ULTRASOUND ABDOMEN LIMITED RIGHT UPPER QUADRANT COMPARISON:  CT 05/21/2017 FINDINGS: Gallbladder: No gallstones or wall thickening visualized. No sonographic Murphy sign noted by sonographer. Common bile duct: Diameter: 2 mm. Liver: No focal lesion identified. Within normal limits in parenchymal echogenicity. Portal vein is patent on color Doppler imaging with normal direction of blood flow towards the liver. Other: None. IMPRESSION: Normal right upper quadrant ultrasound. Electronically Signed   By: Duanne GuessNicholas  Plundo D.O.   On: 01/04/2020 12:38    (Echo, Carotid, EGD, Colonoscopy, ERCP)    Subjective:  Patient resting in bed awake alert headache better continues to have some right-sided upper abdominal discomfort but better. Discharge Exam: Vitals:    01/07/20 0900 01/07/20 1131  BP:  105/66  Pulse:  69  Resp: 20 18  Temp:  98.3 F (36.8 C)  SpO2:  100%   Vitals:   01/07/20 0700 01/07/20 0803 01/07/20 0900 01/07/20 1131  BP:  (!) 103/59  105/66  Pulse:  77  69  Resp: 18 18 20 18   Temp:  98.6 F (37 C)  98.3 F (36.8 C)  TempSrc:  Oral  Oral  SpO2:  100%  100%  Weight:      Height:        General: Pt is alert, awake, not in acute distress Cardiovascular: RRR, S1/S2 +, no rubs, no gallops Respiratory: CTA bilaterally, no wheezing, no rhonchi Abdominal: Soft, NT, ND, bowel sounds + decreased right CVA tenderness Extremities: no edema, no cyanosis    The results of significant diagnostics from this hospitalization (including imaging, microbiology, ancillary and laboratory) are listed below for reference.     Microbiology: Recent Results (from the past 240 hour(s))  Urine culture     Status: Abnormal (Preliminary result)   Collection Time: 01/04/20 10:40 AM   Specimen: Urine, Random  Result Value Ref Range Status   Specimen Description URINE, RANDOM  Final   Special Requests NONE  Final   Culture (A)  Final    >=100,000 COLONIES/mL ESCHERICHIA COLI SUSCEPTIBILITIES TO FOLLOW    Report Status PENDING  Incomplete   Organism ID, Bacteria ESCHERICHIA COLI (A)  Final  Susceptibility   Escherichia coli - MIC*    AMPICILLIN >=32 RESISTANT Resistant     CEFAZOLIN <=4 SENSITIVE Sensitive     CEFTRIAXONE <=0.25 SENSITIVE Sensitive     CIPROFLOXACIN <=0.25 SENSITIVE Sensitive     GENTAMICIN <=1 SENSITIVE Sensitive     IMIPENEM <=0.25 SENSITIVE Sensitive     NITROFURANTOIN <=16 SENSITIVE Sensitive     TRIMETH/SULFA <=20 SENSITIVE Sensitive     AMPICILLIN/SULBACTAM >=32 RESISTANT Resistant     PIP/TAZO Value in next row Sensitive      <=4 SENSITIVEPerformed at [facility], [address] [place], [place] [address]    * >=100,000 COLONIES/mL ESCHERICHIA COLI  SARS Coronavirus 2 by RT PCR (hospital order,  performed in [facility] hospital lab) Nasopharyngeal Nasopharyngeal Swab     Status: None   Collection Time: 01/04/20 12:58 PM   Specimen: Nasopharyngeal Swab  Result Value Ref Range Status   SARS Coronavirus 2 NEGATIVE NEGATIVE Final    Comment: (NOTE) SARS-CoV-2 target nucleic acids are NOT DETECTED.  The SARS-CoV-2 RNA is generally detectable in upper and lower respiratory specimens during the acute phase of infection. The lowest concentration of SARS-CoV-2 viral copies this assay can detect is 250 copies / mL. A negative result does not preclude SARS-CoV-2 infection and should not be used as the sole basis for treatment or other patient management decisions.  A negative result may occur with improper specimen collection / handling, submission of specimen other than nasopharyngeal swab, presence of viral mutation(s) within the areas targeted by this assay, and inadequate number of viral copies (<250 copies / mL). A negative result must be combined with clinical observations, patient history, and epidemiological information.  Fact Sheet for Patients:   BoilerBrush.com.cy  Fact Sheet for Healthcare Providers: https://pope.com/  This test is not yet approved or  cleared by the [place] FDA and has been authorized for detection and/or diagnosis of SARS-CoV-2 by FDA under an Emergency Use Authorization (EUA).  This EUA will remain in effect (meaning this test can be used) for the duration of the COVID-19 declaration under Section 564(b)(1) of the Act, 21 U.S.C. section 360bbb-3(b)(1), unless the authorization is terminated or revoked sooner.  Performed at [facility], [address] [place], [place] [address]      Labs: BNP (last 3 results) No results for input(s): BNP in the last 8760 hours. Basic Metabolic Panel: Recent Labs  Lab 01/04/20 1104 01/05/20 0519 01/06/20 0544  NA 130* 132* 134*  K 2.8* 3.9  3.9  CL 99 105 106  CO2 18* 19* 21*  GLUCOSE 209* 111* 93  BUN 12 8 6   CREATININE 1.19* 0.93 0.72  CALCIUM 8.8* 7.9* 8.0*  MG 1.5*  --   --    Liver Function Tests: Recent Labs  Lab 01/04/20 1104 01/06/20 0544  AST 22 27  ALT 16 23  ALKPHOS 78 65  BILITOT 0.9 0.6  PROT 7.9 6.4*  ALBUMIN 3.7 2.9*   Recent Labs  Lab 01/04/20 1104  LIPASE 18   No results for input(s): AMMONIA in the last 168 hours. CBC: Recent Labs  Lab 01/04/20 1104 01/05/20 0519 01/06/20 0544  WBC 22.7* 13.6* 8.3  HGB 13.0 10.7* 11.4*  HCT 38.3 31.7* 32.8*  MCV 88.7 88.8 88.4  PLT 151 125* 144*   Cardiac Enzymes: No results for input(s): CKTOTAL, CKMB, CKMBINDEX, TROPONINI in the last 168 hours. BNP: Invalid input(s): POCBNP CBG: No results for input(s): GLUCAP in the last 168  hours. D-Dimer No results for input(s): DDIMER in the last 72 hours. Hgb A1c Recent Labs    01/06/20 0544  HGBA1C 5.1   Lipid Profile No results for input(s): CHOL, HDL, LDLCALC, TRIG, CHOLHDL, LDLDIRECT in the last 72 hours. Thyroid function studies No results for input(s): TSH, T4TOTAL, T3FREE, THYROIDAB in the last 72 hours.  Invalid input(s): FREET3 Anemia work up No results for input(s): VITAMINB12, FOLATE, FERRITIN, TIBC, IRON, RETICCTPCT in the last 72 hours. Urinalysis    Component Value Date/Time   COLORURINE AMBER (A) 01/04/2020 1040   APPEARANCEUR TURBID (A) 01/04/2020 1040   LABSPEC 1.014 01/04/2020 1040   PHURINE 5.0 01/04/2020 1040   GLUCOSEU >=500 (A) 01/04/2020 1040   HGBUR MODERATE (A) 01/04/2020 1040   BILIRUBINUR NEGATIVE 01/04/2020 1040   KETONESUR 5 (A) 01/04/2020 1040   PROTEINUR >=300 (A) 01/04/2020 1040   NITRITE POSITIVE (A) 01/04/2020 1040   LEUKOCYTESUR LARGE (A) 01/04/2020 1040   Sepsis Labs Invalid input(s): PROCALCITONIN,  WBC,  LACTICIDVEN Microbiology Recent Results (from the past 240 hour(s))  Urine culture     Status: Abnormal (Preliminary result)   Collection  Time: 01/04/20 10:40 AM   Specimen: Urine, Random  Result Value Ref Range Status   Specimen Description URINE, RANDOM  Final   Special Requests NONE  Final   Culture (A)  Final    >=100,000 COLONIES/mL ESCHERICHIA COLI SUSCEPTIBILITIES TO FOLLOW    Report Status PENDING  Incomplete   Organism ID, Bacteria ESCHERICHIA COLI (A)  Final      Susceptibility   Escherichia coli - MIC*    AMPICILLIN >=32 RESISTANT Resistant     CEFAZOLIN <=4 SENSITIVE Sensitive     CEFTRIAXONE <=0.25 SENSITIVE Sensitive     CIPROFLOXACIN <=0.25 SENSITIVE Sensitive     GENTAMICIN <=1 SENSITIVE Sensitive     IMIPENEM <=0.25 SENSITIVE Sensitive     NITROFURANTOIN <=16 SENSITIVE Sensitive     TRIMETH/SULFA <=20 SENSITIVE Sensitive     AMPICILLIN/SULBACTAM >=32 RESISTANT Resistant     PIP/TAZO Value in next row Sensitive      <=4 SENSITIVEPerformed at [facility], [address] [place], [place] [address]    * >=100,000 COLONIES/mL ESCHERICHIA COLI  SARS Coronavirus 2 by RT PCR (hospital order, performed in [facility] hospital lab) Nasopharyngeal Nasopharyngeal Swab     Status: None   Collection Time: 01/04/20 12:58 PM   Specimen: Nasopharyngeal Swab  Result Value Ref Range Status   SARS Coronavirus 2 NEGATIVE NEGATIVE Final    Comment: (NOTE) SARS-CoV-2 target nucleic acids are NOT DETECTED.  The SARS-CoV-2 RNA is generally detectable in upper and lower respiratory specimens during the acute phase of infection. The lowest concentration of SARS-CoV-2 viral copies this assay can detect is 250 copies / mL. A negative result does not preclude SARS-CoV-2 infection and should not be used as the sole basis for treatment or other patient management decisions.  A negative result may occur with improper specimen collection / handling, submission of specimen other than nasopharyngeal swab, presence of viral mutation(s) within the areas targeted by this assay, and inadequate number of viral copies (<250  copies / mL). A negative result must be combined with clinical observations, patient history, and epidemiological information.  Fact Sheet for Patients:   BoilerBrush.com.cy  Fact Sheet for Healthcare Providers: https://pope.com/  This test is not yet approved or  cleared by the [place] FDA and has been authorized for detection and/or diagnosis of SARS-CoV-2 by FDA under an  Emergency Use Authorization (EUA).  This EUA will remain in effect (meaning this test can be used) for the duration of the COVID-19 declaration under Section 564(b)(1) of the Act, 21 U.S.C. section 360bbb-3(b)(1), unless the authorization is terminated or revoked sooner.  Performed at [facility], [address], [place], [place] [address]      Time coordinating discharge:  39 minutes  SIGNED:   Alwyn Ren, MD  [facility] 01/07/2020, 2:03 PM Pager   If 7PM-7AM, please contact night-coverage

## 2020-01-07 NOTE — Progress Notes (Signed)
Pt discharged home. Discharge instructions, prescriptions, and follow up appointments given to and reviewed with pt. Pt verbalized understanding. Escorted by axillary.   

## 2020-01-08 LAB — URINE CULTURE: Culture: >=100000 — AB

## 2020-01-13 ENCOUNTER — Encounter: Payer: Self-pay | Admitting: Neurology

## 2020-02-02 ENCOUNTER — Encounter: Payer: Self-pay | Admitting: Obstetrics

## 2020-02-23 ENCOUNTER — Telehealth: Payer: Self-pay | Admitting: Obstetrics and Gynecology

## 2020-02-23 NOTE — Telephone Encounter (Signed)
Patient scheduled 10/4 for nexplanon placement with [facility].

## 2020-02-24 NOTE — Progress Notes (Deleted)
NEUROLOGY CONSULTATION NOTE  Stephanie Navarro MRN: [id number] DOB: 03-26-1996  Referring provider: Blanchard Mane, MD (hospital referral) Primary care provider: [facility]  Reason for consult:  Idiopathic intracranial hypertension  HISTORY OF PRESENT ILLNESS: Stephanie Navarro is a 24 year old ***-handed female who presents for evaluation of idiopathic intracranial hypertension.  History supplemented by hospital records.  CT head personally reviewed.  She was admitted to the hospital on 01/04/2020 for acute pyelonephritis.  She was found to be about [redacted] weeks pregnant.  She also endorsed dizziness and headache.  ***.  She was treated with magnesium sulfate.  CT of head showed partially empty sella but otherwise unremarkable.      PAST MEDICAL HISTORY: Past Medical History:  Diagnosis Date  . Acute appendicitis with localized peritonitis 05/21/2017  . Encounter for procreative genetic counseling 06/16/2018   Formatting of this note might be different from the original. Genetic counseling visit on 06/24/2018  Aneuploidy screening/ testing:  Genetic counseling visit pending  Carrier screening:  Genetic counseling visit pending   WGS study eligible: no  GENETIC COUNSELING PREVISIT SUMMARY Referring provider: [place] [facility]  Indication: FTS Other relevant history:  Insurance: [id number] P - (Medicaid)  M    PAST SURGICAL HISTORY: Past Surgical History:  Procedure Laterality Date  . APPENDECTOMY  05/21/2017   Dr. Excell Seltzer   . LAPAROSCOPIC APPENDECTOMY N/A 05/21/2017   Procedure: APPENDECTOMY LAPAROSCOPIC;  Surgeon: Lattie Haw, MD;  Location: [facility] ORS;  Service: General;  Laterality: N/A;    MEDICATIONS: Current Outpatient Medications on File Prior to Visit  Medication Sig Dispense Refill  . prenatal vitamin w/FE, FA (PRENATAL 1 + 1) 27-1 MG TABS tablet Take 1 tablet by mouth daily. 30 tablet 0   No current facility-administered medications on file prior  to visit.    ALLERGIES: No Known Allergies  FAMILY HISTORY: Family History  Problem Relation Age of Onset  . Asthma Sister   . Breast cancer Maternal Grandmother   . Cancer Neg Hx   . Hypertension Neg Hx   . Diabetes Neg Hx   . Heart disease Neg Hx    ***.  SOCIAL HISTORY: Social History   Socioeconomic History  . Marital status: Single    Spouse name: Not on file  . Number of children: Not on file  . Years of education: Not on file  . Highest education level: Not on file  Occupational History  . Not on file  Tobacco Use  . Smoking status: Current Every Day Smoker    Packs/day: 0.50    Types: Cigarettes  . Smokeless tobacco: Never Used  Vaping Use  . Vaping Use: Never used  Substance and Sexual Activity  . Alcohol use: No  . Drug use: No  . Sexual activity: Yes    Birth control/protection: Implant  Other Topics Concern  . Not on file  Social History Narrative  . Not on file   Social Determinants of Health   Financial Resource Strain:   . Difficulty of Paying Living Expenses: Not on file  Food Insecurity:   . Worried About Programme researcher, broadcasting/film/video in the Last Year: Not on file  . Ran Out of Food in the Last Year: Not on file  Transportation Needs:   . Lack of Transportation (Medical): Not on file  . Lack of Transportation (Non-Medical): Not on file  Physical Activity:   . Days of Exercise per Week: Not on file  . Minutes of Exercise  per Session: Not on file  Stress:   . Feeling of Stress : Not on file  Social Connections:   . Frequency of Communication with Friends and Family: Not on file  . Frequency of Social Gatherings with Friends and Family: Not on file  . Attends Religious Services: Not on file  . Active Member of Clubs or Organizations: Not on file  . Attends Banker Meetings: Not on file  . Marital Status: Not on file  Intimate Partner Violence:   . Fear of Current or Ex-Partner: Not on file  . Emotionally Abused: Not on file  .  Physically Abused: Not on file  . Sexually Abused: Not on file    REVIEW OF SYSTEMS: Constitutional: No fevers, chills, or sweats, no generalized fatigue, change in appetite Eyes: No visual changes, double vision, eye pain Ear, nose and throat: No hearing loss, ear pain, nasal congestion, sore throat Cardiovascular: No chest pain, palpitations Respiratory:  No shortness of breath at rest or with exertion, wheezes GastrointestinaI: No nausea, vomiting, diarrhea, abdominal pain, fecal incontinence Genitourinary:  No dysuria, urinary retention or frequency Musculoskeletal:  No neck pain, back pain Integumentary: No rash, pruritus, skin lesions Neurological: as above Psychiatric: No depression, insomnia, anxiety Endocrine: No palpitations, fatigue, diaphoresis, mood swings, change in appetite, change in weight, increased thirst Hematologic/Lymphatic:  No purpura, petechiae. Allergic/Immunologic: no itchy/runny eyes, nasal congestion, recent allergic reactions, rashes  PHYSICAL EXAM: *** General: No acute distress.  Patient appears ***-groomed.  *** Head:  Normocephalic/atraumatic Eyes:  fundi examined but not visualized Neck: supple, no paraspinal tenderness, full range of motion Back: No paraspinal tenderness Heart: regular rate and rhythm Lungs: Clear to auscultation bilaterally. Vascular: No carotid bruits. Neurological Exam: Mental status: alert and oriented to person, place, and time, recent and remote memory intact, fund of knowledge intact, attention and concentration intact, speech fluent and not dysarthric, language intact. Cranial nerves: CN I: not tested CN II: pupils equal, round and reactive to light, visual fields intact CN III, IV, VI:  full range of motion, no nystagmus, no ptosis CN V: facial sensation intact CN VII: upper and lower face symmetric CN VIII: hearing intact CN IX, X: gag intact, uvula midline CN XI: sternocleidomastoid and trapezius muscles  intact CN XII: tongue midline Bulk & Tone: normal, no fasciculations. Motor:  5/5 throughout *** Sensation:  Pinprick *** temperature *** and vibration sensation intact.  ***. Deep Tendon Reflexes:  2+ throughout, *** toes downgoing.  *** Finger to nose testing:  Without dysmetria.  *** Heel to shin:  Without dysmetria.  *** Gait:  Normal station and stride.  Able to turn and tandem walk. Romberg ***.  IMPRESSION: ***  PLAN: ***  Thank you for allowing me to take part in the care of this patient.  Shon Millet, DO  CC: ***

## 2020-02-24 NOTE — Telephone Encounter (Signed)
Noted. Will order to arrive by apt date/time. 

## 2020-02-28 ENCOUNTER — Ambulatory Visit: Payer: Medicaid Other | Admitting: Neurology

## 2020-03-13 ENCOUNTER — Ambulatory Visit: Payer: Medicaid Other | Admitting: Obstetrics and Gynecology

## 2020-04-21 ENCOUNTER — Ambulatory Visit: Payer: Medicaid Other

## 2020-04-24 ENCOUNTER — Ambulatory Visit: Payer: Medicaid Other

## 2020-06-19 ENCOUNTER — Ambulatory Visit: Payer: Medicaid Other

## 2020-07-18 ENCOUNTER — Encounter: Payer: Self-pay | Admitting: Family Medicine

## 2020-07-18 ENCOUNTER — Ambulatory Visit: Payer: Medicaid Other | Admitting: Family Medicine

## 2020-07-18 ENCOUNTER — Other Ambulatory Visit: Payer: Self-pay

## 2020-07-18 DIAGNOSIS — Z113 Encounter for screening for infections with a predominantly sexual mode of transmission: Secondary | ICD-10-CM | POA: Diagnosis not present

## 2020-07-18 LAB — WET PREP FOR TRICH, YEAST, CLUE
Trichomonas Exam: NEGATIVE
Yeast Exam: NEGATIVE

## 2020-07-18 LAB — HIV ANTIBODY (ROUTINE TESTING W REFLEX): HIV 1&2 Ab, 4th Generation: NONREACTIVE
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
## 2020-07-18 NOTE — Progress Notes (Signed)
Wet mount reviewed by provider, no tx per provider orders. Provider orders completed. 

## 2020-07-23 LAB — GONOCOCCUS CULTURE

## 2020-07-24 ENCOUNTER — Other Ambulatory Visit: Payer: Self-pay

## 2020-07-24 NOTE — Progress Notes (Signed)
RN abstracted non reactive HIV. Stephanie Holycross, RN  

## 2020-09-28 ENCOUNTER — Ambulatory Visit: Payer: Medicaid Other

## 2020-11-01 NOTE — Addendum Note (Signed)
Addended by: Heywood Bene on: 11/01/2020 02:54 PM   Modules accepted: Orders

## 2021-04-03 NOTE — Telephone Encounter (Signed)
No Show for appointment 

## 2021-05-16 ENCOUNTER — Ambulatory Visit: Payer: Medicaid Other

## 2021-06-10 NOTE — L&D Delivery Note (Signed)
Delivery Note  First Stage: Labor onset: 0900 Augmentation : AROM Analgesia /Anesthesia intrapartum: epidural AROM at 2116  Second Stage: Complete dilation at 2350 Onset of pushing at 2356 FHR second stage : n/a, pushed one contraction and delivered  Delivery of a viable female infant 03/31/22 at 2357 by Stephanie Navarro, CNM delivery of fetal head in ROA position with restitution to ROT. No nuchal cord; Right nuchal hand present. Anterior then posterior shoulders delivered easily with gentle downward traction. Baby placed on mom's chest, and attended to by peds.  Cord double clamped after cessation of pulsation, cut by FOB Cord blood sample collected   Third Stage: Placenta delivered Stephanie Navarro intact with 3 VC @ 0002 Placenta disposition: discarded Uterine tone firm / bleeding scant  No laceration identified  Anesthesia for repair: n/a Repair none Est. Blood Loss (mL): 841LK  Complications: none  Mom to postpartum.  Baby to Couplet care / Skin to Skin.  Newborn: Birth Weight: TBD, infant skin-to-skin  Apgar Scores: 8, 9 Feeding planned: breast and bottle feeding (maternal UDS positive for cocaine so will not be breastfeeding in the hospital)

## 2021-08-08 ENCOUNTER — Ambulatory Visit (LOCAL_COMMUNITY_HEALTH_CENTER): Payer: Medicaid Other

## 2021-08-08 ENCOUNTER — Other Ambulatory Visit: Payer: Self-pay

## 2021-08-08 VITALS — BP 103/62 | Ht 63.0 in | Wt 141.5 lb

## 2021-08-08 DIAGNOSIS — Z3201 Encounter for pregnancy test, result positive: Secondary | ICD-10-CM | POA: Diagnosis not present

## 2021-08-08 LAB — PREGNANCY, URINE: Preg Test, Ur: POSITIVE — AB

## 2021-08-08 MED ORDER — PRENATAL 27-0.8 MG PO TABS: 1.0000 | ORAL_TABLET | Freq: Every day | ORAL | 0 refills | Status: AC

## 2021-08-08 NOTE — Progress Notes (Signed)
UPT positive. Plans prenatal care at [facility]. PHQ 2- 9 = 5 today. States sad recently d/t death of family member, but has support of family. To DSS for medicaid/preg women. Jerel Shepherd, RN ? ?

## 2021-08-16 IMAGING — US US ABDOMEN LIMITED
1 series · 14 of 25 positions shown · non-contrast
Comparison: CT 05/21/2017

CLINICAL DATA: Upper abdominal pain for 4 days

EXAM:
ULTRASOUND ABDOMEN LIMITED RIGHT UPPER QUADRANT

[Series 1: us abdomen limited ruq · 14 of 114 slices shown]
[im 1/114]
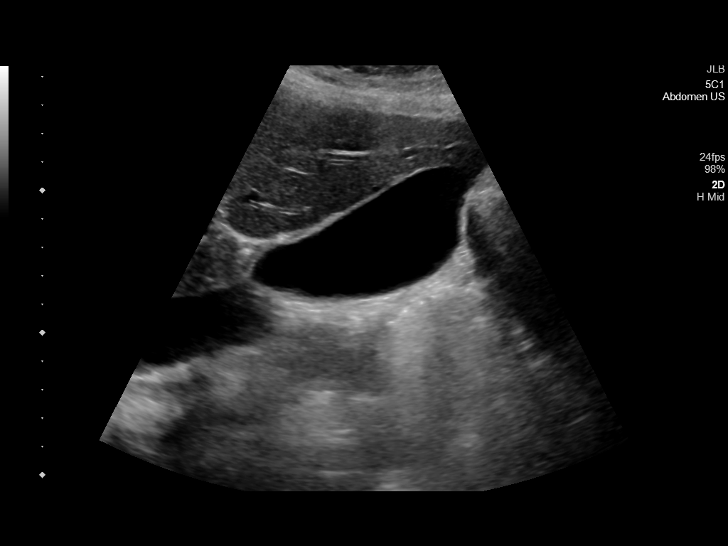
[im 10/114]
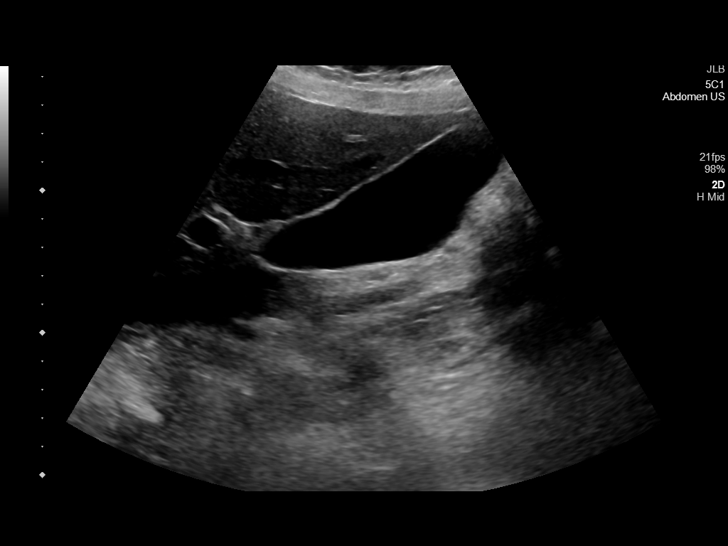
[im 19/114]
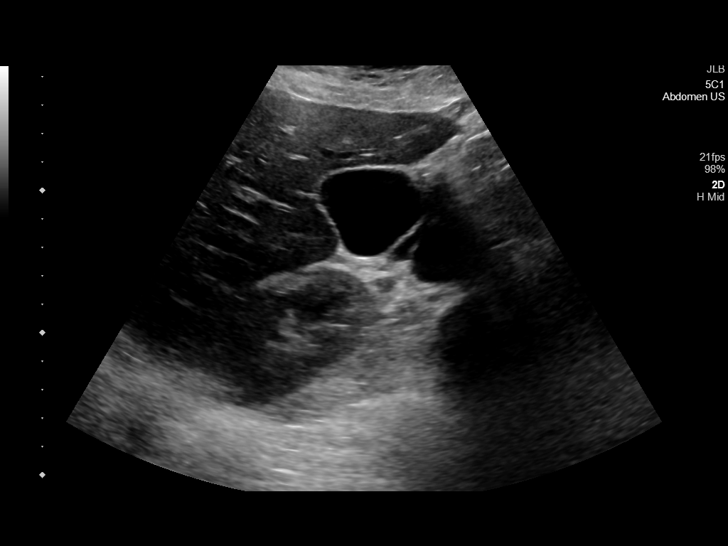
[im 29/114]
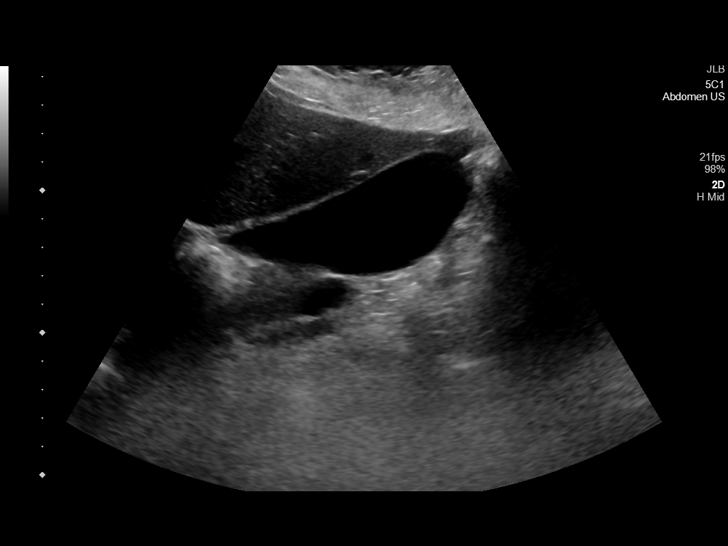
[im 38/114]
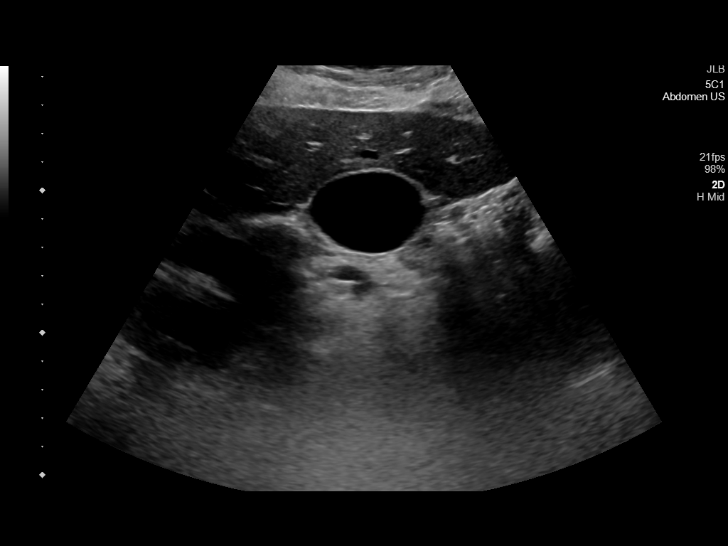
[im 43/114]
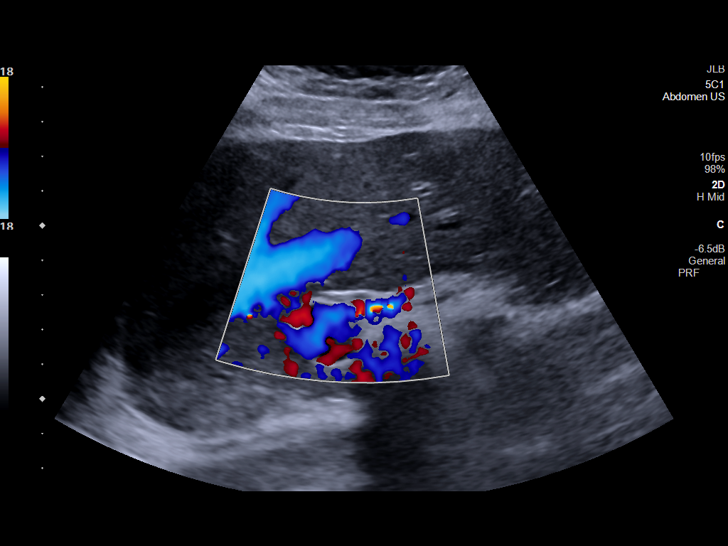
[im 52/114]
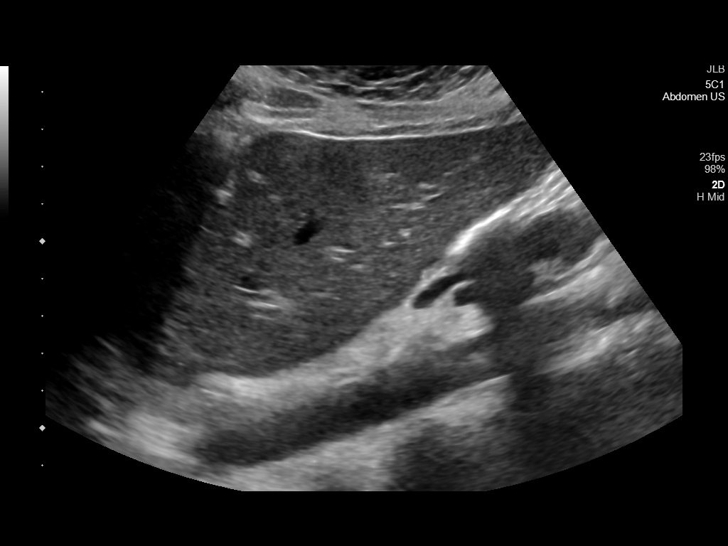
[im 62/114]
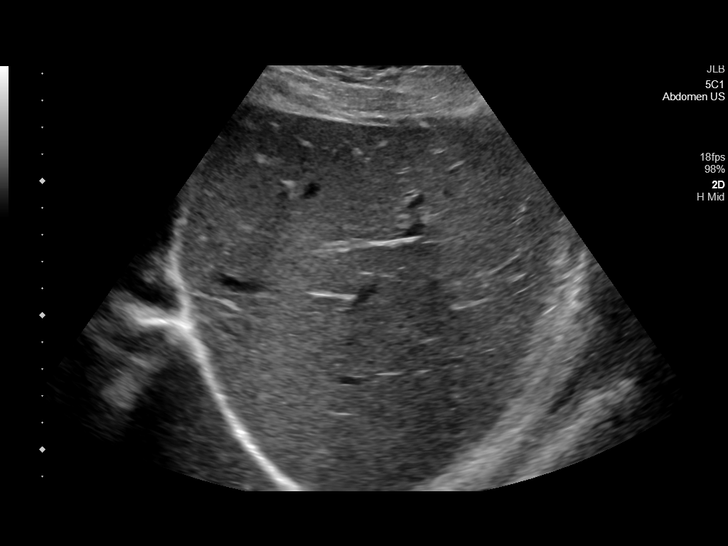
[im 71/114]
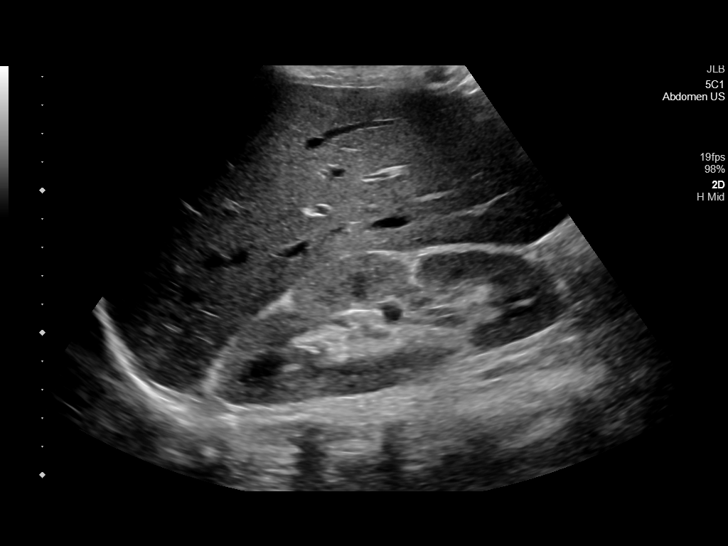
[im 76/114]
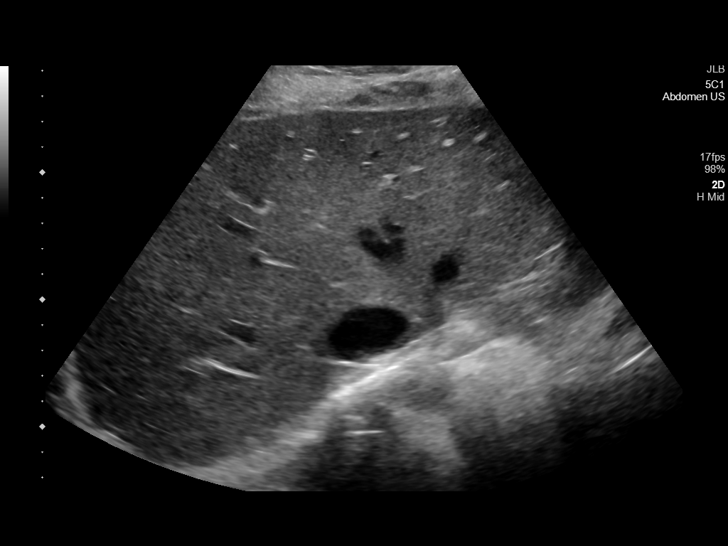
[im 85/114]
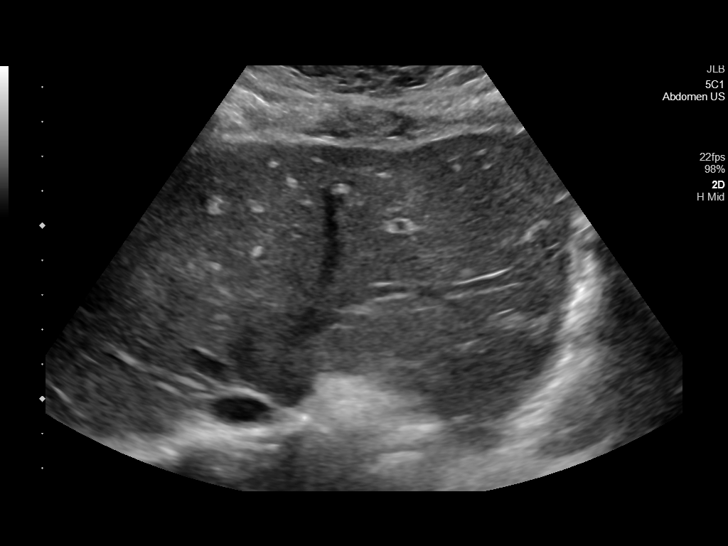
[im 95/114]
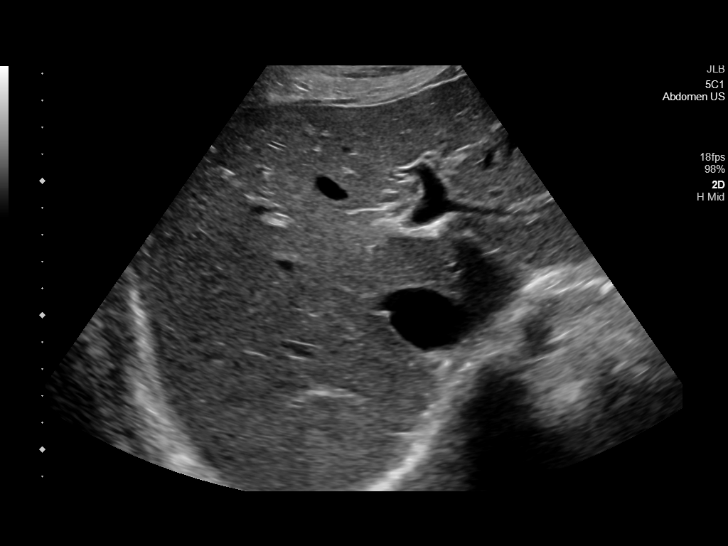
[im 104/114]
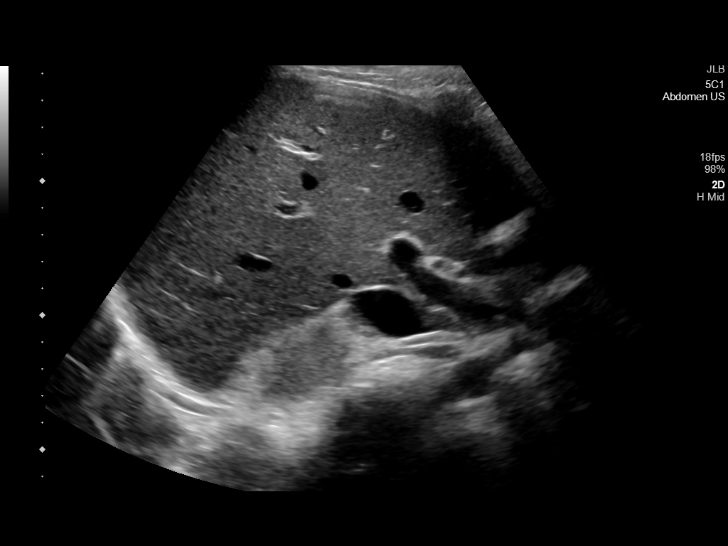
[im 114/114]
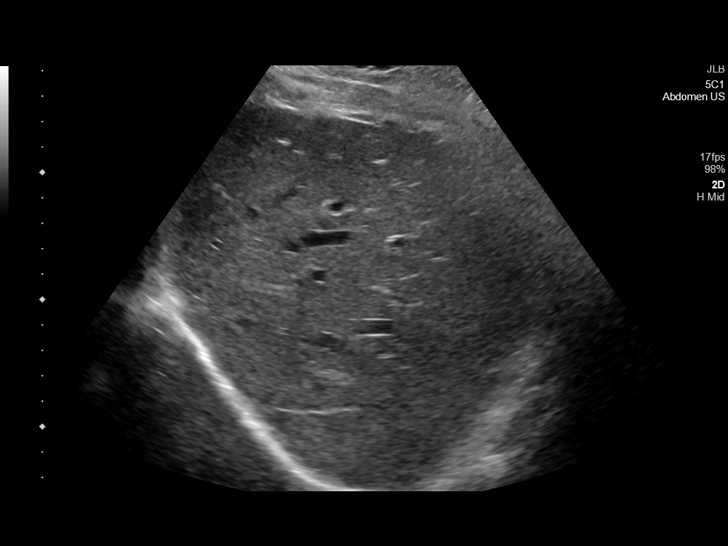

[14 of 25 positions shown; findings below may reference images not displayed]

FINDINGS: Gallbladder:

No gallstones or wall thickening visualized. No sonographic Murphy
sign noted by sonographer.

Common bile duct:

Diameter: 2 mm.

Liver:

No focal lesion identified. Within normal limits in parenchymal
echogenicity. Portal vein is patent on color Doppler imaging with
normal direction of blood flow towards the liver.

Other: None.
IMPRESSION: Normal right upper quadrant ultrasound.

## 2021-08-18 IMAGING — CT CT HEAD W/O CM
3 series · 15 of 45 positions shown, 18 images · non-contrast
Comparison: No pertinent prior exams are available for comparison.

CLINICAL DATA: Dizziness, nonspecific. Additional provided by
scanning: Headache for 4 days.

EXAM:
CT HEAD WITHOUT CONTRAST
TECHNIQUE: Contiguous axial images were obtained from the base of the skull
through the vertex without intravenous contrast.

[Series 2: head wo · axial · 0.39mm/px · z∈[+65,+180]mm · 9 of 28 slices shown, 12 images]
[im 3/28  brain]
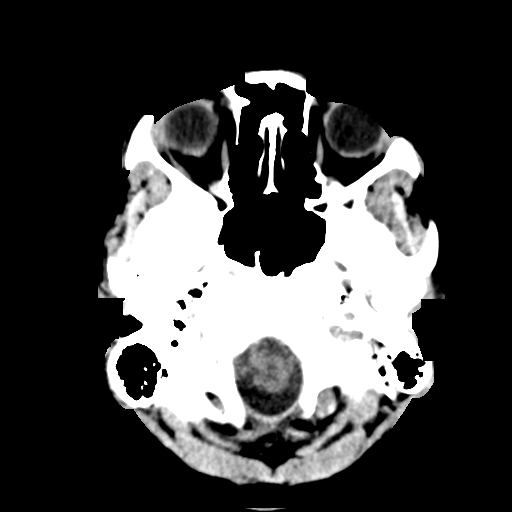
[im 3/28  bone]
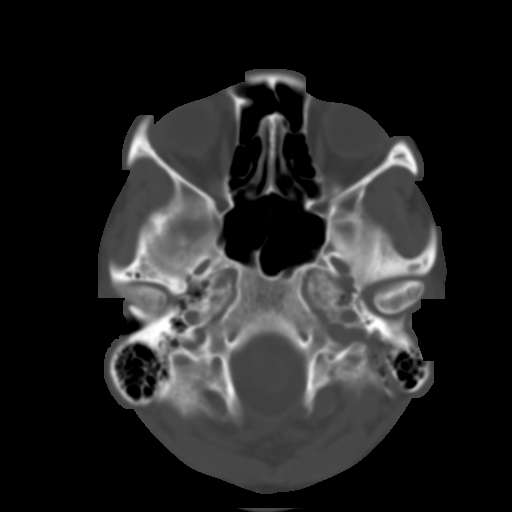
[im 6/28  brain]
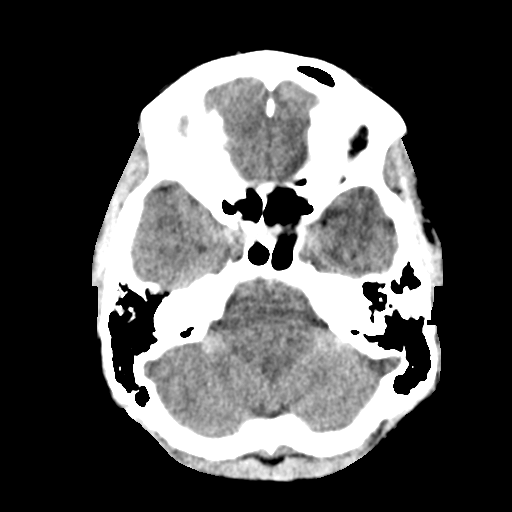
[im 9/28  brain]
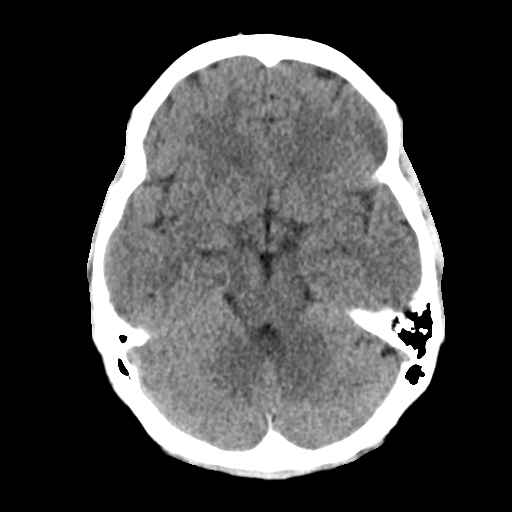
[im 12/28  brain]
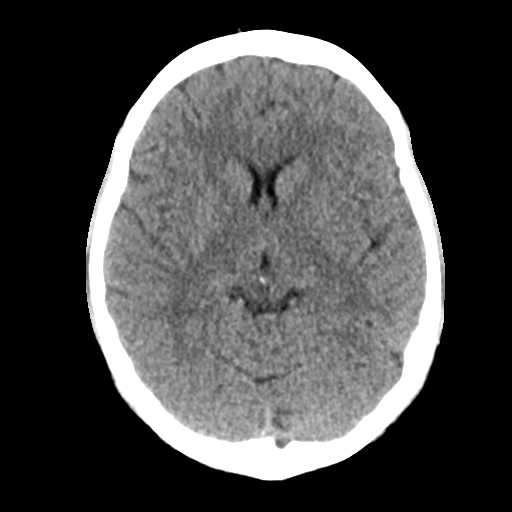
[im 15/28  brain]
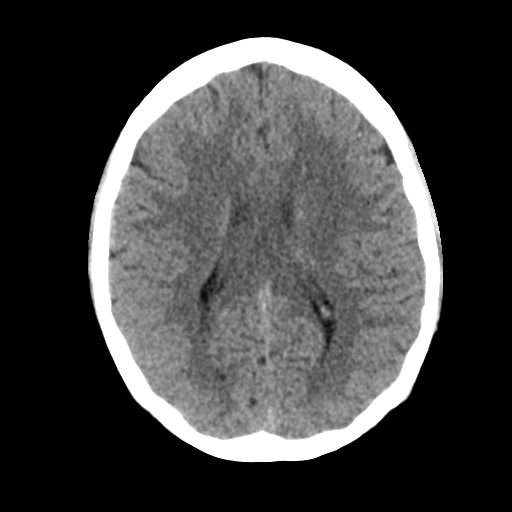
[im 15/28  bone]
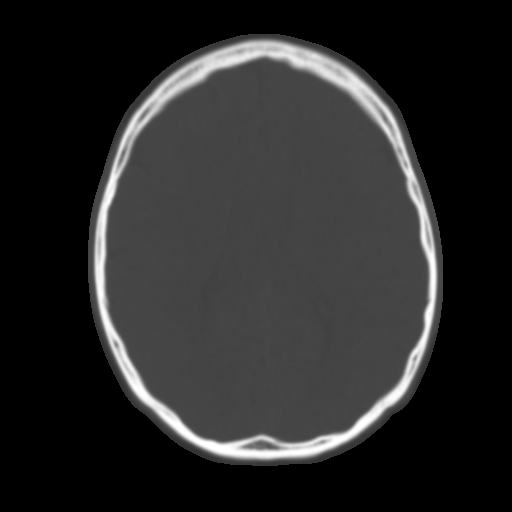
[im 17/28  brain]
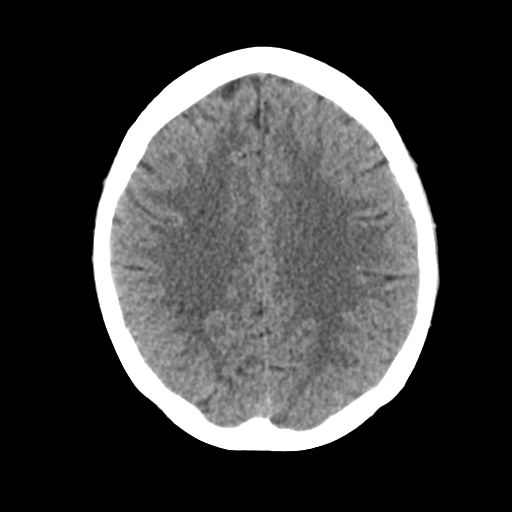
[im 20/28  brain]
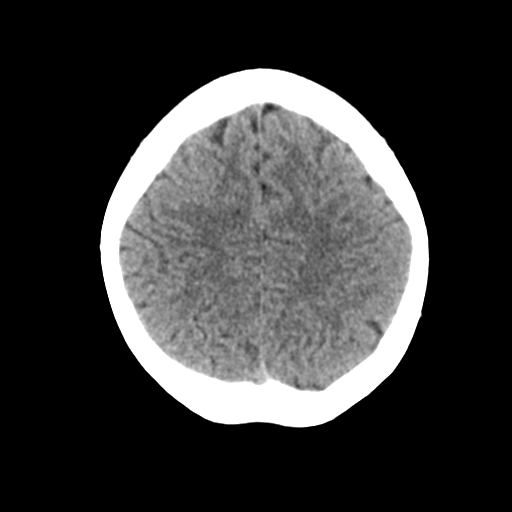
[im 23/28  brain]
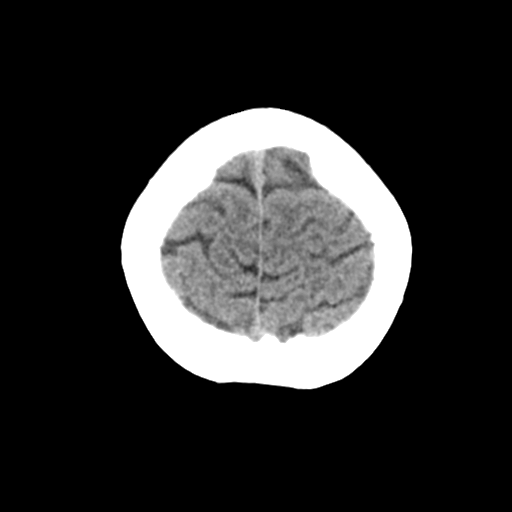
[im 26/28  brain]
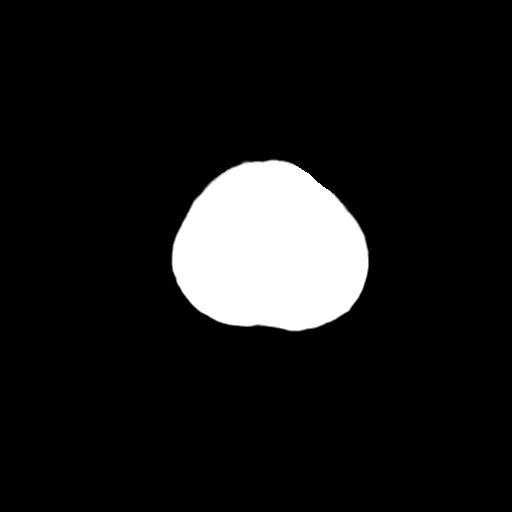
[im 26/28  bone]
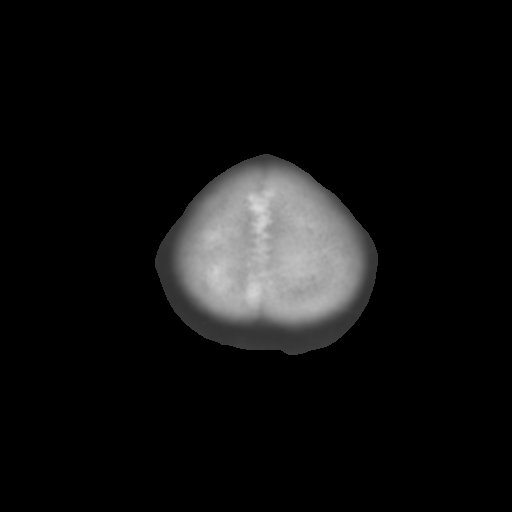

[Series 4: coronal soft tissue · coronal · 0.27mm/px · 3 of 63 slices shown]
[im 21/63  brain]
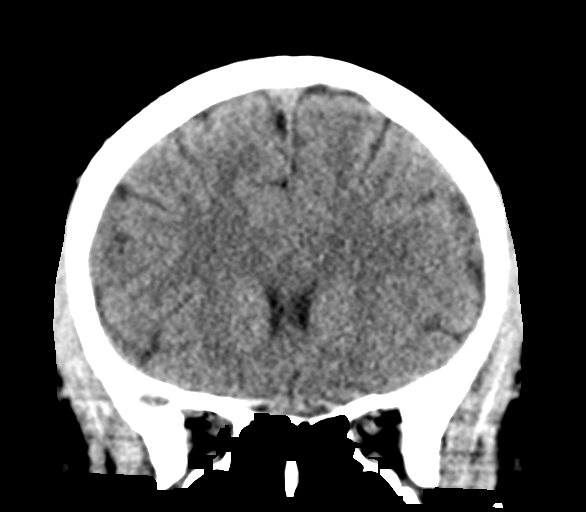
[im 28/63  brain]
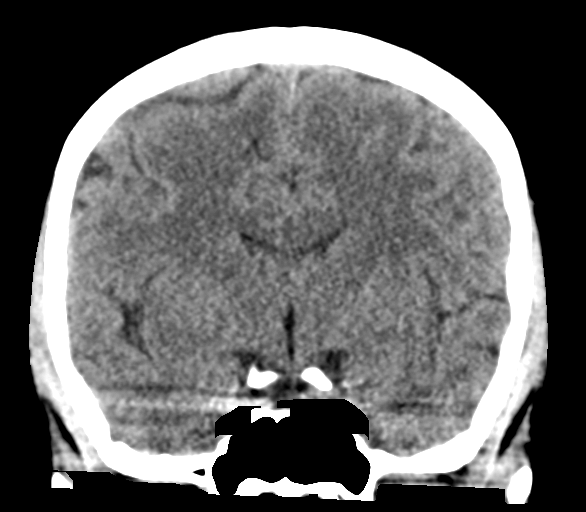
[im 35/63  brain]
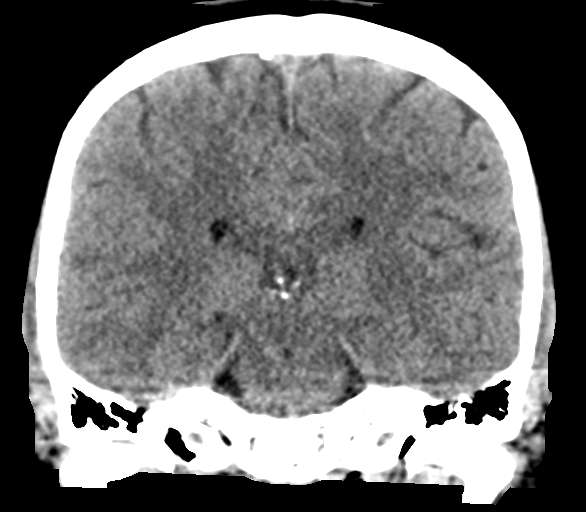

[Series 5: sagittal soft tissue · sagittal · 0.30mm/px · 3 of 57 slices shown]
[im 19/57  brain]
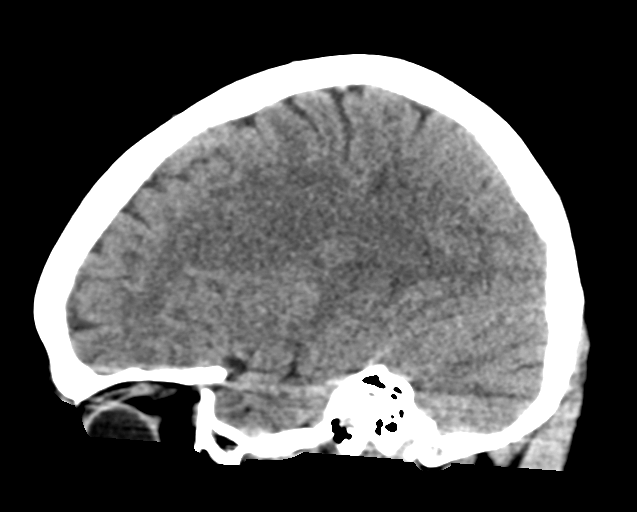
[im 29/57  brain]
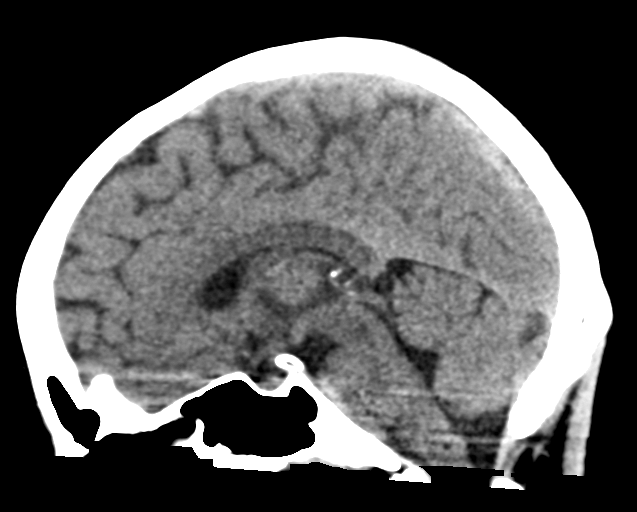
[im 38/57  brain]
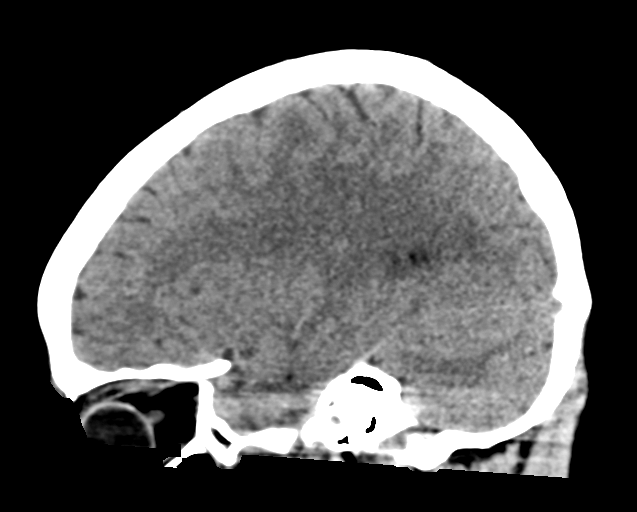

[15 of 45 positions shown; findings below may reference images not displayed]

FINDINGS: Brain:

Cerebral volume is normal.

There is no acute intracranial hemorrhage.

No demarcated cortical infarct.

No extra-axial fluid collection.

No evidence of intracranial mass.

No midline shift.

Partially empty sella turcica.

Vascular: No hyperdense vessel.

Skull: Normal. Negative for fracture or focal lesion.

Sinuses/Orbits: Visualized orbits show no acute finding. No
significant paranasal sinus disease or mastoid effusion at the
imaged levels.
IMPRESSION: No evidence of acute intracranial abnormality.

Partially empty sella turcica. This finding is nonspecific, but can
be associated with idiopathic intracranial hypertension.

Otherwise unremarkable non-contrast CT appearance of the brain.

## 2021-08-28 ENCOUNTER — Other Ambulatory Visit: Payer: Self-pay

## 2021-08-28 ENCOUNTER — Ambulatory Visit (INDEPENDENT_AMBULATORY_CARE_PROVIDER_SITE_OTHER): Payer: Medicaid Other | Admitting: Obstetrics

## 2021-08-28 ENCOUNTER — Other Ambulatory Visit (HOSPITAL_COMMUNITY)
Admission: RE | Admit: 2021-08-28 | Discharge: 2021-08-28 | Disposition: A | Discharge status: Home / Self Care | Payer: Medicaid Other | Source: Ambulatory Visit | Attending: Obstetrics | Admitting: Obstetrics

## 2021-08-28 ENCOUNTER — Encounter: Payer: Self-pay | Admitting: Obstetrics

## 2021-08-28 VITALS — BP 118/74 | Wt 153.0 lb

## 2021-08-28 DIAGNOSIS — Z113 Encounter for screening for infections with a predominantly sexual mode of transmission: Secondary | ICD-10-CM | POA: Diagnosis present

## 2021-08-28 DIAGNOSIS — Z3480 Encounter for supervision of other normal pregnancy, unspecified trimester: Secondary | ICD-10-CM | POA: Insufficient documentation

## 2021-08-28 DIAGNOSIS — F1290 Cannabis use, unspecified, uncomplicated: Secondary | ICD-10-CM

## 2021-08-28 DIAGNOSIS — Z124 Encounter for screening for malignant neoplasm of cervix: Secondary | ICD-10-CM | POA: Diagnosis present

## 2021-08-28 NOTE — Progress Notes (Signed)
? ? ? ? ? ?New Obstetric Patient H&P  ? ? ?Chief Complaint: "Desires prenatal care" ? ? ?History of Present Illness: Patient is a 26 y.o. [id number] Not Hispanic or Latino female, LMP 06/30/2021 presents with amenorrhea and positive home pregnancy test. Based on her  LMP, her EDD is Estimated Date of Delivery: 04/06/22 and her EGA is [redacted]w[redacted]d. Cycles are 6. days, regular, and occur approximately every : 28 days. Her last pap smear was about 2 years ago and was no abnormalities.  ?  ?She had a urine pregnancy test which was positive about 4 week(s)  ago. Her last menstrual period was normal and lasted for  about 6 day(s). Since her LMP she claims she has experienced nausea. She denies vaginal bleeding. Her past medical history is noncontributory. Her prior pregnancies are notable for none ? ?Since her LMP, she admits to the use of tobacco products  Yes She smokes cigarettes, and has been using MJ , but has now stopped the weed. ?She claims she has gained   no pounds since the start of her pregnancy.  ?There are cats in the home in the home  no  ?She admits close contact with children on a regular basis  yes  ?She has had chicken pox in the past no ?She has had Tuberculosis exposures, symptoms, or previously tested positive for TB   no ?Current or past history of domestic violence. no ? ?Genetic Screening/Teratology Counseling: (Includes patient, baby's father, or anyone in either family with:)  ? ?1. Patient's age >/= 107 at [facility]  no ?2. Thalassemia ([place], [place], Mediterranean, or Asian background): MCV<80  no ?3. Neural tube defect (meningomyelocele, spina bifida, anencephaly)  no ?4. Congenital heart defect  no  ?5. Down syndrome  no ?6. Tay-Sachs (Jewish, [place]  no ?7. Canavan's Disease  no ?8. Sickle cell disease or trait (African)  no  ?9. Hemophilia or other blood disorders  no  ?10. Muscular dystrophy  no  ?11. Cystic fibrosis  no  ?12. Huntington's Chorea  no  ?13. Mental retardation/autism  no ?14.  Other inherited genetic or chromosomal disorder  no ?15. Maternal metabolic disorder (DM, PKU, etc)  no ?16. Patient or FOB with a child with a birth defect not listed above no  ?16a. Patient or FOB with a birth defect themselves no ?17. Recurrent pregnancy loss, or stillbirth  no  ?18. Any medications since LMP other than prenatal vitamins (include vitamins, supplements, OTC meds, drugs, alcohol)  no ?19. Any other genetic/environmental exposure to discuss  no ? ?Infection History:  ? ?1. Lives with someone with TB or TB exposed  no  ?2. Patient or partner has history of genital herpes  no ?3. Rash or viral illness since LMP  no ?4. History of STI (GC, CT, HPV, syphilis, HIV)  yes ?5. History of recent travel :  no ? ?Other pertinent information:  yes - strong smell of MJ in the exam room ? ? ? ?Review of Systems:10 point review of systems negative unless otherwise noted in HPI ? ?Past Medical History:  ?Past Medical History:  ?Diagnosis Date  ?? Acute appendicitis with localized peritonitis 05/21/2017  ?? Chlamydia infection affecting pregnancy in third trimester, antepartum 01/14/2019  ?? Encounter for procreative genetic counseling 06/16/2018  ? Formatting of this note might be different from the original. Genetic counseling visit on 06/24/2018 ? Aneuploidy screening/ testing:  Genetic counseling visit pending ? Carrier screening:  Genetic counseling visit pending  ? WGS  study eligible: no  GENETIC COUNSELING PREVISIT SUMMARY Referring provider: [place] [facility]  Indication: FTS Other relevant history:  Insurance: [id number] P - (Medicaid)  M  ?? IUP (intrauterine pregnancy), incidental 01/04/2020  ? ? ?Past Surgical History:  ?Past Surgical History:  ?Procedure Laterality Date  ?? APPENDECTOMY  05/21/2017  ? Dr. Excell Seltzer   ?? LAPAROSCOPIC APPENDECTOMY N/A 05/21/2017  ? Procedure: APPENDECTOMY LAPAROSCOPIC;  Surgeon: Lattie Haw, MD;  Location: [facility] ORS;  Service: General;  Laterality: N/A;  ? ? ?Gynecologic History:  Patient's last menstrual period was 06/30/2021 (exact date). ? ?Obstetric History: [id number] ? ?Family History:  ?Family History  ?Problem Relation Age of Onset  ?? Asthma Sister   ?? Breast cancer Maternal Grandmother   ?? Cancer Neg Hx   ?? Hypertension Neg Hx   ?? Diabetes Neg Hx   ?? Heart disease Neg Hx   ? ? ?Social History:  ?Social History  ? ?Socioeconomic History  ?? Marital status: Single  ?  Spouse name: Not on file  ?? Number of children: Not on file  ?? Years of education: Not on file  ?? Highest education level: Not on file  ?Occupational History  ?? Not on file  ?Tobacco Use  ?? Smoking status: Every Day  ?  Packs/day: 0.50  ?  Types: Cigarettes  ?  Passive exposure: Current  ?? Smokeless tobacco: Never  ?? Tobacco comments:  ?  Smokes cig daily, planning to quit  ?Vaping Use  ?? Vaping Use: Former  ?? Substances: Flavoring  ?Substance and Sexual Activity  ?? Alcohol use: Not Currently  ?  Alcohol/week: 2.0 standard drinks  ?  Types: 2 Standard drinks or equivalent per week  ?  Comment: last use- 07/14/21- "3 shots"  ?? Drug use: Yes  ?  Types: Marijuana  ?  Comment: not since found out she was pregnant  ?? Sexual activity: Yes  ?  Birth control/protection: None  ?  Comment: hx nexplanon - removed 2019  ?Other Topics Concern  ?? Not on file  ?Social History Narrative  ?? Not on file  ? ?Social Determinants of Health  ? ?Financial Resource Strain: Not on file  ?Food Insecurity: Not on file  ?Transportation Needs: Not on file  ?Physical Activity: Not on file  ?Stress: Not on file  ?Social Connections: Not on file  ?Intimate Partner Violence: Not At Risk  ?? Fear of Current or Ex-Partner: No  ?? Emotionally Abused: No  ?? Physically Abused: No  ?? Sexually Abused: No  ? ? ?Allergies:  ?No Known Allergies ? ?Medications: ?Prior to Admission medications   ?Medication Sig Start Date End Date Taking? Authorizing Provider  ?Prenatal Vit-Fe Fumarate-FA (MULTIVITAMIN-PRENATAL) 27-0.8 MG TABS tablet Take 1 tablet  by mouth daily at 12 noon. 08/08/21 11/16/21 Yes Federico Flake, MD  ?prenatal vitamin w/FE, FA (PRENATAL 1 + 1) 27-1 MG TABS tablet Take 1 tablet by mouth daily. ?Patient not taking: Reported on 07/18/2020 01/08/20   Alwyn Ren, MD  ? ? ?Physical Exam ?Vitals: Blood pressure 118/74, weight 153 lb (69.4 kg), last menstrual period 06/30/2021. ? ?General: NAD ?HEENT: normocephalic, anicteric ?Thyroid: no enlargement, no palpable nodules ?Pulmonary: No increased work of breathing, CTAB ?Cardiovascular: RRR, distal pulses 2+ ?Abdomen: NABS, soft, non-tender, non-distended.  Umbilicus without lesions.  No hepatomegaly, splenomegaly or masses palpable. No evidence of hernia  ?Genitourinary: ? External: Normal external female genitalia.  Normal urethral meatus, normal  Bartholin's and Skene's glands.   ? Vagina:  Normal vaginal mucosa, no evidence of prolapse.   ? Cervix: Grossly normal in appearance, no bleeding ? Uterus: anteverted, Non-enlarged, mobile, normal contour.  No CMT ? Adnexa: ovaries non-enlarged, no adnexal masses ? Rectal: deferred ?Extremities: no edema, erythema, or tenderness ?Neurologic: Grossly intact ?Psychiatric: mood appropriate, affect full ? ? ?Assessment: 26 y.o. [id number] at 3945w3d presenting to initiate prenatal care ? ?Plan: ?1) Avoid alcoholic beverages. ?2) Patient encouraged not to smoke.  ?3) Discontinue the use of all non-medicinal drugs and chemicals.  ?4) Take prenatal vitamins daily.  ?5) Nutrition, food safety (fish, cheese advisories, and high nitrite foods) and exercise discussed. ?6) Hospital and practice style discussed with cross coverage system.  ?7) Genetic Screening, such as with 1st Trimester Screening, cell free fetal DNA, AFP testing, and Ultrasound, as well as with amniocentesis and CVS as appropriate, is discussed with patient. At the conclusion of today's visit patient requested genetic testing ?8) Patient is asked about travel to areas at risk for the Zika virus,  and counseled to avoid travel and exposure to mosquitoes or sexual partners who may have themselves been exposed to the virus. Testing is discussed, and will be ordered as appropriate.  ?Cultures and pap

## 2021-08-28 NOTE — Progress Notes (Signed)
NOB today. LMP 06/30/2021. Pt has been really nauseas. COP at [facility].  ?

## 2021-08-30 ENCOUNTER — Encounter: Payer: Self-pay | Admitting: Obstetrics

## 2021-08-30 DIAGNOSIS — B9689 Other specified bacterial agents as the cause of diseases classified elsewhere: Secondary | ICD-10-CM | POA: Insufficient documentation

## 2021-08-30 LAB — CERVICOVAGINAL ANCILLARY ONLY
Bacterial Vaginitis (gardnerella): POSITIVE — AB
Chlamydia: NEGATIVE
Comment: NEGATIVE
Comment: NEGATIVE
Comment: NEGATIVE
Comment: NORMAL
Neisseria Gonorrhea: NEGATIVE
Trichomonas: NEGATIVE

## 2021-08-30 LAB — URINE CULTURE

## 2021-08-31 LAB — CYTOLOGY - PAP: Diagnosis: NEGATIVE

## 2021-09-04 LAB — URINE DRUG PANEL 7
Amphetamines, Urine: NEGATIVE ng/mL
Barbiturate Quant, Ur: NEGATIVE ng/mL
Benzodiazepine Quant, Ur: NEGATIVE ng/mL
Cannabinoid Quant, Ur: POSITIVE — AB
Cocaine (Metab.): NEGATIVE ng/mL
Opiate Quant, Ur: NEGATIVE ng/mL
PCP Quant, Ur: NEGATIVE ng/mL

## 2021-09-05 ENCOUNTER — Encounter: Payer: Self-pay | Admitting: Obstetrics

## 2021-09-05 DIAGNOSIS — O99320 Drug use complicating pregnancy, unspecified trimester: Secondary | ICD-10-CM | POA: Insufficient documentation

## 2021-09-11 ENCOUNTER — Ambulatory Visit: Admission: RE | Admit: 2021-09-11 | Payer: Medicaid Other | Source: Ambulatory Visit

## 2021-09-11 DIAGNOSIS — Z3483 Encounter for supervision of other normal pregnancy, third trimester: Secondary | ICD-10-CM | POA: Insufficient documentation

## 2021-09-18 ENCOUNTER — Encounter: Payer: Medicaid Other | Admitting: Obstetrics

## 2021-10-17 ENCOUNTER — Other Ambulatory Visit: Payer: Self-pay | Admitting: Neurology

## 2021-10-17 DIAGNOSIS — R519 Headache, unspecified: Secondary | ICD-10-CM

## 2021-10-18 ENCOUNTER — Other Ambulatory Visit: Payer: Self-pay | Admitting: Neurology

## 2021-10-18 ENCOUNTER — Ambulatory Visit
Admission: RE | Admit: 2021-10-18 | Discharge: 2021-10-18 | Disposition: A | Discharge status: Home / Self Care | Payer: Medicaid Other | Source: Ambulatory Visit | Attending: Neurology | Admitting: Neurology

## 2021-10-18 DIAGNOSIS — R519 Headache, unspecified: Secondary | ICD-10-CM

## 2022-02-20 DIAGNOSIS — Z3482 Encounter for supervision of other normal pregnancy, second trimester: Secondary | ICD-10-CM | POA: Diagnosis not present

## 2022-03-07 DIAGNOSIS — Z3483 Encounter for supervision of other normal pregnancy, third trimester: Secondary | ICD-10-CM | POA: Diagnosis not present

## 2022-03-13 DIAGNOSIS — Z3482 Encounter for supervision of other normal pregnancy, second trimester: Secondary | ICD-10-CM | POA: Diagnosis not present

## 2022-03-13 DIAGNOSIS — Z3483 Encounter for supervision of other normal pregnancy, third trimester: Secondary | ICD-10-CM | POA: Diagnosis not present

## 2022-03-13 DIAGNOSIS — N898 Other specified noninflammatory disorders of vagina: Secondary | ICD-10-CM | POA: Diagnosis not present

## 2022-03-13 DIAGNOSIS — Z113 Encounter for screening for infections with a predominantly sexual mode of transmission: Secondary | ICD-10-CM | POA: Diagnosis not present

## 2022-03-19 DIAGNOSIS — O99013 Anemia complicating pregnancy, third trimester: Secondary | ICD-10-CM | POA: Insufficient documentation

## 2022-03-19 DIAGNOSIS — Z2839 Other underimmunization status: Secondary | ICD-10-CM | POA: Insufficient documentation

## 2022-03-31 ENCOUNTER — Other Ambulatory Visit: Payer: Self-pay

## 2022-03-31 ENCOUNTER — Inpatient Hospital Stay: Payer: Medicaid Other | Admitting: Anesthesiology

## 2022-03-31 ENCOUNTER — Encounter: Payer: Self-pay | Admitting: Obstetrics and Gynecology

## 2022-03-31 ENCOUNTER — Other Ambulatory Visit: Payer: Self-pay | Admitting: Certified Nurse Midwife

## 2022-03-31 ENCOUNTER — Inpatient Hospital Stay
Admission: EM | Admit: 2022-03-31 | Discharge: 2022-04-02 | DRG: 806 | Disposition: A | Discharge status: Home / Self Care | Payer: Medicaid Other | Source: Ambulatory Visit | Attending: Obstetrics | Admitting: Obstetrics

## 2022-03-31 DIAGNOSIS — Z23 Encounter for immunization: Secondary | ICD-10-CM | POA: Diagnosis not present

## 2022-03-31 DIAGNOSIS — O99334 Smoking (tobacco) complicating childbirth: Principal | ICD-10-CM | POA: Diagnosis present

## 2022-03-31 DIAGNOSIS — F1290 Cannabis use, unspecified, uncomplicated: Secondary | ICD-10-CM

## 2022-03-31 DIAGNOSIS — Z3490 Encounter for supervision of normal pregnancy, unspecified, unspecified trimester: Secondary | ICD-10-CM

## 2022-03-31 DIAGNOSIS — O99320 Drug use complicating pregnancy, unspecified trimester: Secondary | ICD-10-CM

## 2022-03-31 DIAGNOSIS — O9081 Anemia of the puerperium: Secondary | ICD-10-CM | POA: Diagnosis not present

## 2022-03-31 DIAGNOSIS — N760 Acute vaginitis: Principal | ICD-10-CM

## 2022-03-31 DIAGNOSIS — F17210 Nicotine dependence, cigarettes, uncomplicated: Secondary | ICD-10-CM | POA: Diagnosis present

## 2022-03-31 DIAGNOSIS — Z3480 Encounter for supervision of other normal pregnancy, unspecified trimester: Secondary | ICD-10-CM

## 2022-03-31 DIAGNOSIS — D62 Acute posthemorrhagic anemia: Secondary | ICD-10-CM | POA: Diagnosis not present

## 2022-03-31 DIAGNOSIS — Z3A39 39 weeks gestation of pregnancy: Secondary | ICD-10-CM | POA: Diagnosis not present

## 2022-03-31 DIAGNOSIS — O26893 Other specified pregnancy related conditions, third trimester: Secondary | ICD-10-CM | POA: Diagnosis present

## 2022-03-31 DIAGNOSIS — O99323 Drug use complicating pregnancy, third trimester: Secondary | ICD-10-CM

## 2022-03-31 DIAGNOSIS — B9689 Other specified bacterial agents as the cause of diseases classified elsewhere: Principal | ICD-10-CM

## 2022-03-31 HISTORY — DX: Anemia, unspecified: D64.9

## 2022-03-31 LAB — CBC
HCT: 29.6 % — ABNORMAL LOW (ref 36.0–46.0)
Hemoglobin: 9.8 g/dL — ABNORMAL LOW (ref 12.0–15.0)
MCH: 28.6 pg (ref 26.0–34.0)
MCHC: 33.1 g/dL (ref 30.0–36.0)
MCV: 86.3 fL (ref 80.0–100.0)
Platelets: 207 10*3/uL (ref 150–400)
RBC: 3.43 MIL/uL — ABNORMAL LOW (ref 3.87–5.11)
RDW: 14.6 % (ref 11.5–15.5)
WBC: 7.3 10*3/uL (ref 4.0–10.5)
nRBC: 0 % (ref 0.0–0.2)

## 2022-03-31 LAB — URINE DRUG SCREEN, QUALITATIVE (ARMC ONLY)
Amphetamines, Ur Screen: NOT DETECTED
Barbiturates, Ur Screen: NOT DETECTED
Benzodiazepine, Ur Scrn: NOT DETECTED
Cannabinoid 50 Ng, Ur ~~LOC~~: POSITIVE — AB
Cocaine Metabolite,Ur ~~LOC~~: POSITIVE — AB
MDMA (Ecstasy)Ur Screen: NOT DETECTED
Methadone Scn, Ur: NOT DETECTED
Opiate, Ur Screen: NOT DETECTED
Phencyclidine (PCP) Ur S: NOT DETECTED
Tricyclic, Ur Screen: NOT DETECTED

## 2022-03-31 LAB — TYPE AND SCREEN
ABO/RH(D): O POS
Antibody Screen: NEGATIVE

## 2022-03-31 MED ORDER — TERBUTALINE SULFATE 1 MG/ML IJ SOLN: 0.2500 mg | Freq: Once | INTRAMUSCULAR | Status: DC | PRN

## 2022-03-31 MED ORDER — DIPHENHYDRAMINE HCL 50 MG/ML IJ SOLN: 12.5000 mg | INTRAMUSCULAR | Status: DC | PRN

## 2022-03-31 MED ORDER — BUPIVACAINE HCL (PF) 0.25 % IJ SOLN
INTRAMUSCULAR | Status: DC | PRN
  Administered 2022-03-31: 8 mL via EPIDURAL

## 2022-03-31 MED ORDER — FENTANYL-BUPIVACAINE-NACL 0.5-0.125-0.9 MG/250ML-% EP SOLN
12.0000 mL/h | EPIDURAL | Status: DC | PRN
  Administered 2022-03-31: 12 mL/h via EPIDURAL
  Filled 2022-03-31: qty 250

## 2022-03-31 MED ORDER — OXYTOCIN-SODIUM CHLORIDE 30-0.9 UT/500ML-% IV SOLN: 1.0000 m[IU]/min | INTRAVENOUS | Status: DC

## 2022-03-31 MED ORDER — MISOPROSTOL 25 MCG QUARTER TABLET: 25.0000 ug | ORAL_TABLET | ORAL | Status: DC

## 2022-03-31 MED ORDER — OXYTOCIN BOLUS FROM INFUSION
333.0000 mL | Freq: Once | INTRAVENOUS | Status: AC
  Administered 2022-03-31: 333 mL via INTRAVENOUS

## 2022-03-31 MED ORDER — PHENYLEPHRINE 80 MCG/ML (10ML) SYRINGE FOR IV PUSH (FOR BLOOD PRESSURE SUPPORT): 80.0000 ug | PREFILLED_SYRINGE | INTRAVENOUS | Status: DC | PRN

## 2022-03-31 MED ORDER — ACETAMINOPHEN 325 MG PO TABS: 650.0000 mg | ORAL_TABLET | ORAL | Status: DC | PRN

## 2022-03-31 MED ORDER — LACTATED RINGERS IV SOLN: 500.0000 mL | INTRAVENOUS | Status: DC | PRN

## 2022-03-31 MED ORDER — LIDOCAINE HCL (PF) 1 % IJ SOLN: 30.0000 mL | INTRAMUSCULAR | Status: DC | PRN

## 2022-03-31 MED ORDER — LACTATED RINGERS IV SOLN: INTRAVENOUS | Status: DC

## 2022-03-31 MED ORDER — EPHEDRINE 5 MG/ML INJ: 10.0000 mg | INTRAVENOUS | Status: DC | PRN

## 2022-03-31 MED ORDER — LIDOCAINE-EPINEPHRINE (PF) 1.5 %-1:200000 IJ SOLN
INTRAMUSCULAR | Status: DC | PRN
  Administered 2022-03-31: 3 mL via EPIDURAL

## 2022-03-31 MED ORDER — SOD CITRATE-CITRIC ACID 500-334 MG/5ML PO SOLN: 30.0000 mL | ORAL | Status: DC | PRN

## 2022-03-31 MED ORDER — LACTATED RINGERS IV SOLN
500.0000 mL | Freq: Once | INTRAVENOUS | Status: AC
  Administered 2022-03-31: 500 mL via INTRAVENOUS

## 2022-03-31 MED ORDER — OXYTOCIN-SODIUM CHLORIDE 30-0.9 UT/500ML-% IV SOLN
2.5000 [IU]/h | INTRAVENOUS | Status: DC
  Administered 2022-04-01: 2.5 [IU]/h via INTRAVENOUS
  Filled 2022-03-31: qty 500

## 2022-03-31 MED ORDER — ONDANSETRON HCL 4 MG/2ML IJ SOLN: 4.0000 mg | Freq: Four times a day (QID) | INTRAMUSCULAR | Status: DC | PRN

## 2022-03-31 NOTE — Progress Notes (Signed)
[id number] at [redacted]w[redacted]d, LMP of unknown, not c/w early [place] at [redacted]w[redacted]d.  Scheduled for induction of labor for elective at term on 04/01/22.   Prenatal provider: [facility] Pregnancy complicated by: Iron deficiency anemia Smoker - cigarettes and marijuana (reports quit in April) Varicella non-immune Migraines and periods of blacking out    Prenatal Labs: Blood type/Rh O pos  Antibody screen neg  Rubella Immune  Varicella NON-Immune  RPR NR  HBsAg Neg  HIV NR  GC neg  Chlamydia neg  Genetic screening cfDNA negative, female  1 hour GTT 105  3 hour GTT N/a  GBS Negative   Tdap: declined Flu: declined Contraception: BTL (consents signed 02/21/22) Feeding preference: TBD  ____ Gertie Fey, CNM  Certified Nurse Midwife [facility]

## 2022-03-31 NOTE — Anesthesia Preprocedure Evaluation (Addendum)
Anesthesia Evaluation  Patient identified by MRN, date of birth, ID band Patient awake    Reviewed: Allergy & Precautions, NPO status , Patient's Chart, lab work & pertinent test results  History of Anesthesia Complications Negative for: history of anesthetic complications  Airway Mallampati: I   Neck ROM: Full    Dental no notable dental hx.    Pulmonary Current Smoker and Patient abstained from smoking.,    Pulmonary exam normal breath sounds clear to auscultation       Cardiovascular Exercise Tolerance: Good negative cardio ROS Normal cardiovascular exam Rhythm:Regular Rate:Normal     Neuro/Psych  Headaches, Cocaine and marijuana use    GI/Hepatic negative GI ROS,   Endo/Other  negative endocrine ROS  Renal/GU negative Renal ROS     Musculoskeletal   Abdominal   Peds  Hematology  (+) Blood dyscrasia, anemia ,   Anesthesia Other Findings   Reproductive/Obstetrics                            Anesthesia Physical Anesthesia Plan  ASA: 3  Anesthesia Plan: Epidural   Post-op Pain Management:    Induction:   PONV Risk Score and Plan: 2  Airway Management Planned: Natural Airway  Additional Equipment:   Intra-op Plan:   Post-operative Plan:   Informed Consent: I have reviewed the patients History and Physical, chart, labs and discussed the procedure including the risks, benefits and alternatives for the proposed anesthesia with the patient or authorized representative who has indicated his/her understanding and acceptance.     Dental Advisory Given  Plan Discussed with:   Anesthesia Plan Comments: (Patient reports no bleeding problems and no anticoagulant use.   Patient consented for risks of anesthesia including but not limited to:  - adverse reactions to medications - risk of bleeding, infection and or nerve damage from epidural that could lead to paralysis - risk of  headache or failed epidural - nerve damage due to positioning - that if epidural is used for C-section that there is a chance of epidural failure requiring spinal placement or conversion to GA - Damage to heart, brain, lungs, other parts of body or loss of life  Patient voiced understanding.)        Anesthesia Quick Evaluation

## 2022-03-31 NOTE — H&P (Signed)
OB History & Physical   History of Present Illness:  Chief Complaint:   HPI:  Stephanie Navarro is a 26 y.o. [phone] female at [redacted]w[redacted]d dated by [place].  She presents to L&D for uterine contractions since this morning that have become stronger and closer together. She reports they are now 5 minutes apart and she feels them in her lower abdomen. She reports good fetal movement, denies leaking fluid.    Pregnancy Issues: Iron deficiency anemia Smoker - cigarettes and marijuana (reports quit in April) Varicella non-immune Migraines and periods of blacking out     Maternal Medical History:   Past Medical History:  Diagnosis Date   Acute appendicitis with localized peritonitis 05/21/2017   Chlamydia infection affecting pregnancy in third trimester, antepartum 01/14/2019   Encounter for procreative genetic counseling 06/16/2018   Formatting of this note might be different from the original. Genetic counseling visit on 06/24/2018  Aneuploidy screening/ testing:  Genetic counseling visit pending  Carrier screening:  Genetic counseling visit pending   WGS study eligible: no  GENETIC COUNSELING PREVISIT SUMMARY Referring provider: [facility]  Indication: FTS Other relevant history:  Insurance: [id number] P - (Medicaid)  M   IUP (intrauterine pregnancy), incidental 01/04/2020    Past Surgical History:  Procedure Laterality Date   APPENDECTOMY  05/21/2017   Dr. Burt Knack    LAPAROSCOPIC APPENDECTOMY N/A 05/21/2017   Procedure: APPENDECTOMY LAPAROSCOPIC;  Surgeon: Florene Glen, MD;  Location: [facility] ORS;  Service: General;  Laterality: N/A;    No Known Allergies  Prior to Admission medications   Medication Sig Start Date End Date Taking? Authorizing Provider  prenatal vitamin w/FE, FA (PRENATAL 1 + 1) 27-1 MG TABS tablet Take 1 tablet by mouth daily. Patient not taking: Reported on 07/18/2020 01/08/20   Georgette Shell, MD     Prenatal care site: [place]   Social History:  She  reports that she has been smoking cigarettes. She has been smoking an average of .5 packs per day. She has been exposed to tobacco smoke. She has never used smokeless tobacco. She reports that she does not currently use alcohol after a past usage of about 2.0 standard drinks of alcohol per week. She reports current drug use. Drug: Marijuana.  Family History: family history includes Asthma in her sister; Breast cancer in her maternal grandmother.   Review of Systems: A full review of systems was performed and negative except as noted in the HPI.     Physical Exam:  Vital Signs: LMP 06/30/2021 (Exact Date)  General: no acute distress.  HEENT: normocephalic, atraumatic Heart: regular rate & rhythm.  No murmurs/rubs/gallops Lungs: clear to auscultation bilaterally, normal respiratory effort Abdomen: soft, gravid, non-tender;  EFW: 7lb Pelvic:   External: Normal external female genitalia  Cervix:3/70/-1   Extremities: non-tender, symmetric, no edema bilaterally.  DTRs: +2  Neurologic: Alert & oriented x 3.    No results found for this or any previous visit (from the past 24 hour(s)).  Pertinent Results:  Prenatal Labs: Blood type/Rh O pos  Antibody screen neg  Rubella Immune  Varicella NON-Immune  RPR NR  HBsAg Neg  HIV NR  GC neg  Chlamydia neg  Genetic screening negative  1 hour GTT 105  3 hour GTT N/a  GBS Negative   FHT: 125bpm, moderate variability, accelerations present, one variable deceleration TOCO: contractions q75min SVE:  3/38/-2   Cephalic by leopolds  No results found.  Assessment:  Stephanie Navarro is a  14 y.o. [id number] female at [redacted]w[redacted]d with uterine contractions.   Plan:  1. Admit to Labor & Delivery; consents reviewed and obtained  2. Fetal Well being  - Fetal Tracing: Category II tracing (one variable deceleration present, accelerations and moderate variability present) - Group B Streptococcus ppx indicated: n/a, GBS negative - Presentation:  vertex confirmed by SVE   3. Routine OB: - Prenatal labs reviewed, as above - Rh pos - CBC, T&S, RPR on admit - Clear fluids, saline lock  4. Monitoring of Labor -  Contractions q37min, external toco in place -  Pelvis proven to 3280g -  Plan for intermittent fetal monitoring until gets epidural -  Maternal pain control as desired; requesting regional anesthesia - Anticipate vaginal delivery  5. Post Partum Planning: - Infant feeding: breast and bottle feeding - Contraception: BTL  Janyce Llanos, CNM 03/31/22 6:01 PM

## 2022-03-31 NOTE — Progress Notes (Signed)
Labor Progress Note  Stephanie Navarro is a 26 y.o. [phone] at [redacted]w[redacted]d by ultrasound admitted for active labor  Subjective: She is comfortable after her epidural, states she does not feel any contractions at this time  Objective: BP (!) 93/53   Pulse (!) 102   Temp 98.4 F (36.9 C) (Oral)   Resp 16   LMP 06/30/2021 (Exact Date)   SpO2 99%  Notable VS details: reviewed  Fetal Assessment: FHT:  FHR: 120 bpm, variability: moderate,  accelerations:  Present,  decelerations:  Absent Category/reactivity:  Category I UC:   regular, every 1-2 minutes SVE:    Dilation: 5cm  Effacement: 90%  Station:  -1  Consistency: medium  Position: middle  Membrane status:AROM @ 2114 Amniotic color: clear  Labs: Lab Results  Component Value Date   WBC 7.3 03/31/2022   HGB 9.8 (L) 03/31/2022   HCT 29.6 (L) 03/31/2022   MCV 86.3 03/31/2022   PLT 207 03/31/2022    Assessment / Plan: Spontaneous labor, progressing normally  Labor:  Progressing normally, AROM with clear fluid Preeclampsia:  no signs or symptoms of toxicity Fetal Wellbeing:  Category I Pain Control:  Epidural I/D:   GBS negative Anticipated MOD:  NSVD  Gertie Fey, CNM 03/31/2022, 9:17 PM

## 2022-03-31 NOTE — Anesthesia Procedure Notes (Signed)
Epidural Patient location during procedure: OB Start time: 03/31/2022 7:49 PM End time: 03/31/2022 7:59 PM  Staffing Anesthesiologist: Darrin Nipper, MD Performed: anesthesiologist   Preanesthetic Checklist Completed: patient identified, IV checked, risks and benefits discussed, surgical consent, monitors and equipment checked, pre-op evaluation and timeout performed  Epidural Patient position: sitting Prep: Betadine Patient monitoring: heart rate, continuous pulse ox and blood pressure Approach: midline Location: L3-L4 Injection technique: LOR air  Needle:  Needle type: Tuohy  Needle gauge: 17 G Needle length: 9 cm Needle insertion depth: 4.5 cm Catheter at skin depth: 9.5 cm Test dose: negative and 1.5% lidocaine with Epi 1:200 K  Assessment Sensory level: T4  Additional Notes Straightforward placement without apparent complications.Reason for block:procedure for pain

## 2022-04-01 ENCOUNTER — Encounter: Payer: Self-pay | Admitting: Obstetrics and Gynecology

## 2022-04-01 LAB — CBC
HCT: 29 % — ABNORMAL LOW (ref 36.0–46.0)
Hemoglobin: 9.5 g/dL — ABNORMAL LOW (ref 12.0–15.0)
MCH: 28.4 pg (ref 26.0–34.0)
MCHC: 32.8 g/dL (ref 30.0–36.0)
MCV: 86.6 fL (ref 80.0–100.0)
Platelets: 175 10*3/uL (ref 150–400)
RBC: 3.35 MIL/uL — ABNORMAL LOW (ref 3.87–5.11)
RDW: 14.6 % (ref 11.5–15.5)
WBC: 12 10*3/uL — ABNORMAL HIGH (ref 4.0–10.5)
nRBC: 0 % (ref 0.0–0.2)

## 2022-04-01 LAB — RPR: RPR Ser Ql: NONREACTIVE

## 2022-04-01 MED ORDER — BENZOCAINE-MENTHOL 20-0.5 % EX AERO: 1.0000 | INHALATION_SPRAY | CUTANEOUS | Status: DC | PRN

## 2022-04-01 MED ORDER — ONDANSETRON HCL 4 MG/2ML IJ SOLN: 4.0000 mg | INTRAMUSCULAR | Status: DC | PRN

## 2022-04-01 MED ORDER — TETANUS-DIPHTH-ACELL PERTUSSIS 5-2.5-18.5 LF-MCG/0.5 IM SUSY: 0.5000 mL | PREFILLED_SYRINGE | Freq: Once | INTRAMUSCULAR | Status: DC

## 2022-04-01 MED ORDER — ONDANSETRON HCL 4 MG PO TABS: 4.0000 mg | ORAL_TABLET | ORAL | Status: DC | PRN

## 2022-04-01 MED ORDER — ACETAMINOPHEN 325 MG PO TABS
650.0000 mg | ORAL_TABLET | ORAL | Status: DC | PRN
  Administered 2022-04-01: 650 mg via ORAL
  Filled 2022-04-01: qty 2

## 2022-04-01 MED ORDER — IBUPROFEN 600 MG PO TABS
600.0000 mg | ORAL_TABLET | Freq: Four times a day (QID) | ORAL | Status: DC
  Administered 2022-04-01 – 2022-04-02 (×5): 600 mg via ORAL
  Filled 2022-04-01 (×5): qty 1

## 2022-04-01 MED ORDER — WITCH HAZEL-GLYCERIN EX PADS: 1.0000 | MEDICATED_PAD | CUTANEOUS | Status: DC | PRN

## 2022-04-01 MED ORDER — DIPHENHYDRAMINE HCL 25 MG PO CAPS: 25.0000 mg | ORAL_CAPSULE | Freq: Four times a day (QID) | ORAL | Status: DC | PRN

## 2022-04-01 MED ORDER — PRENATAL MULTIVITAMIN CH
1.0000 | ORAL_TABLET | Freq: Every day | ORAL | Status: DC
  Administered 2022-04-01 – 2022-04-02 (×2): 1 via ORAL
  Filled 2022-04-01 (×2): qty 1

## 2022-04-01 MED ORDER — DIBUCAINE (PERIANAL) 1 % EX OINT: 1.0000 | TOPICAL_OINTMENT | CUTANEOUS | Status: DC | PRN

## 2022-04-01 MED ORDER — ZOLPIDEM TARTRATE 5 MG PO TABS: 5.0000 mg | ORAL_TABLET | Freq: Every evening | ORAL | Status: DC | PRN

## 2022-04-01 MED ORDER — MEASLES, MUMPS & RUBELLA VAC IJ SOLR: 0.5000 mL | INTRAMUSCULAR | Status: DC | PRN

## 2022-04-01 MED ORDER — VARICELLA VIRUS VACCINE LIVE 1350 PFU/0.5ML IJ SUSR
0.5000 mL | Freq: Once | INTRAMUSCULAR | Status: AC
  Administered 2022-04-02: 0.5 mL via SUBCUTANEOUS
  Filled 2022-04-01 (×2): qty 0.5

## 2022-04-01 MED ORDER — SENNOSIDES-DOCUSATE SODIUM 8.6-50 MG PO TABS
2.0000 | ORAL_TABLET | Freq: Every day | ORAL | Status: DC
  Administered 2022-04-02: 2 via ORAL
  Filled 2022-04-01: qty 2

## 2022-04-01 MED ORDER — COCONUT OIL OIL: 1.0000 | TOPICAL_OIL | Status: DC | PRN

## 2022-04-01 MED ORDER — SIMETHICONE 80 MG PO CHEW: 80.0000 mg | CHEWABLE_TABLET | ORAL | Status: DC | PRN

## 2022-04-01 MED ORDER — METHYLERGONOVINE MALEATE 0.2 MG/ML IJ SOLN
0.2000 mg | INTRAMUSCULAR | Status: DC | PRN
  Administered 2022-04-01: 0.2 mg via INTRAMUSCULAR
  Filled 2022-04-01 (×2): qty 1

## 2022-04-01 MED ORDER — METHYLERGONOVINE MALEATE 0.2 MG PO TABS: 0.2000 mg | ORAL_TABLET | ORAL | Status: DC | PRN

## 2022-04-01 NOTE — Anesthesia Postprocedure Evaluation (Signed)
Anesthesia Post Note  Patient: Stephanie Navarro  Procedure(s) Performed: AN AD HOC LABOR EPIDURAL  Patient location during evaluation: Mother Baby Anesthesia Type: Epidural Level of consciousness: awake and alert Pain management: pain level controlled Vital Signs Assessment: post-procedure vital signs reviewed and stable Respiratory status: spontaneous breathing, nonlabored ventilation and respiratory function stable Cardiovascular status: stable Postop Assessment: no headache, no backache and epidural receding Anesthetic complications: no   No notable events documented.   Last Vitals:  Vitals:   04/01/22 0400 04/01/22 0750  BP: (!) 106/52 94/62  Pulse: 70 64  Resp: 16 20  Temp: 36.4 C 36.8 C  SpO2: 98% 99%    Last Pain:  Vitals:   04/01/22 0800  TempSrc:   PainSc: 4                  Catelin Manthe B Clarisa Kindred

## 2022-04-01 NOTE — Progress Notes (Signed)
Patient requesting to go outside. RN stated that was against medical advise and contacted the CNM. Patient had moderate bleeding and RN gave IM methergine before patient went outside. Patient knows risks of leaving the floor and left the floor to go outside at 1600. RN removed IV before patient left floor.
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
## 2022-04-01 NOTE — Clinical Social Work Maternal (Addendum)
CLINICAL SOCIAL WORK MATERNAL/CHILD NOTE  Patient Details  Name: Stephanie Navarro MRN: [id number] Date of Birth: January 04, 1996  Date:  04/01/2022  Clinical Social Worker Initiating Note:  Wandra Feinstein, LCSW Date/Time: Initiated:  04/01/22/1117     Child's Name:      Biological Parents:  Mother   Need for Interpreter:  None   Reason for Referral:  Current Substance Use/Substance Use During Pregnancy     Address:  [place] [address]    Phone number:  [phone] (home)     Additional phone number:   Household Members/Support Persons (HM/SP):   Household Member/Support Person 1, Household Member/Support Person 2, Household Member/Support Person 3, Household Member/Support Person 4   HM/SP Name Relationship DOB or Age  HM/SP -[place] mother    HM/SP -2   son 62  HM/SP -3   son 3  HM/SP -[place] (not living in home, address is [place]) FOB    HM/SP -5        HM/SP -6        HM/SP -7        HM/SP -8          Natural Supports (not living in the home):  Extended Family, Immediate Family, Spouse/significant other   Professional Supports:     Employment: Part-time   Type of Work: Paediatric nurse   Education:  [employer] school graduate   Homebound arranged:    Museum/gallery curator Resources:  Medicaid   Other Resources:  [facility]   Cultural/Religious Considerations Which May Impact Care:  none  Strengths:  Ability to meet basic needs  , Home prepared for child  , Pediatrician chosen   Psychotropic Medications:         Pediatrician:    [employer]  Pediatrician List:   [place]      Pediatrician Fax Number:    Risk Factors/Current Problems:  Substance Use     Cognitive State:      Mood/Affect:  Calm  , Relaxed     CSW Assessment: SW spoke with pt to address consult for substance use during pregnancy, at the  time of birth, and baby's positive UDS result for cocaine. Pt admits to marijuana and cocaine use with no plans to stop. Pt aware SW will make CPS report to [facility]. Pt states when report was made to CPS for substance use during pregnancy with her son (now 24 y/o) the [place] worker met with her at home and screened out the report. SW made CPS report to [facility] with [facility] DSS/CPS. Per Cristie Hem, they will f/u in the community and there are no barriers to pt taking baby home at this time.   Pt reports she has services in place [facility], [facility], pediatrician) has all needed equipment for baby including car seat. Pt lives with her mother at [place] and indicates this is a supportive environment. Pt reports the FOB Kimori Darrick Grinder [phone]) is supportive and plans to be involved in their baby's life.   Spoke to RN who reports no concerns at this time. No barriers to dc at this time. SW available as needed.   Wandra Feinstein, MSW, LCSW [phone] (coverage)    CSW Plan/Description:  No Further Intervention Required/No Barriers to Discharge, Child Protective Service Report  Wandra Feinstein [place], [place] 04/01/2022, 11:22 AM

## 2022-04-01 NOTE — Progress Notes (Addendum)
Postpartum Day  1  Subjective: no complaints, up ad lib, voiding, tolerating PO, and + flatus  Doing well, no concerns. Ambulating without difficulty, pain managed with PO meds, tolerating regular diet, and voiding without difficulty.   No fever/chills, chest pain, shortness of breath, nausea/vomiting, or leg pain. No nipple or breast pain. No headache, visual changes, or RUQ/epigastric pain.  Objective: BP 94/62 (BP Location: Right Arm)   Pulse 64   Temp 98.3 F (36.8 C) (Oral)   Resp 20   LMP 06/30/2021 (Exact Date)   SpO2 99%   Breastfeeding Unknown    Physical Exam:  General: alert, cooperative, and appears stated age Breasts: soft/nontender CV: RRR Pulm: nl effort, CTABL Abdomen: soft, non-tender, active bowel sounds Uterine Fundus: firm Perineum: minimal edema, intact Lochia: moderate bleeding noted on exam, will reassess after clean pad is applied DVT Evaluation: No evidence of DVT seen on physical exam. Negative Homan's sign. No cords or calf tenderness. No significant calf/ankle edema.  Recent Labs    03/31/22 1829 04/01/22 0620  HGB 9.8* 9.5*  HCT 29.6* 29.0*  WBC 7.3 12.0*  PLT 207 175    Assessment/Plan: 26 y.o. [id number] postpartum day # 1  -Continue routine postpartum care -Encouraged snug fitting bra, cold application, Tylenol PRN, and cabbage leaves for engorgement for formula feeding  -Discussed contraceptive options -Patient decided on Nexplanon instead of BTL  -Acute blood loss anemia - hemodynamically stable and asymptomatic; start PO ferrous sulfate BID with stool softeners  -Immunization status:   needs Varicella prior to discharge    Disposition: Continue inpatient postpartum care    LOS: 1 day   [place], CNM 04/01/2022, 25:36 AM   ----- Avelino Leeds Certified Nurse Midwife [facility]

## 2022-04-02 NOTE — Progress Notes (Signed)
Patient responsive to teaching. Had no questions at this time. Patient to be wheeled out by volunteer when ready.

## 2022-11-14 ENCOUNTER — Ambulatory Visit: Payer: 59

## 2023-03-20 ENCOUNTER — Ambulatory Visit: Payer: 59

## 2023-04-15 ENCOUNTER — Emergency Department: Payer: 59

## 2023-04-15 ENCOUNTER — Other Ambulatory Visit: Payer: Self-pay

## 2023-04-15 ENCOUNTER — Emergency Department
Admission: EM | Admit: 2023-04-15 | Discharge: 2023-04-15 | Disposition: A | Discharge status: Home / Self Care | Payer: 59 | Attending: Emergency Medicine | Admitting: Emergency Medicine

## 2023-04-15 ENCOUNTER — Encounter: Payer: Self-pay | Admitting: Emergency Medicine

## 2023-04-15 DIAGNOSIS — R109 Unspecified abdominal pain: Secondary | ICD-10-CM | POA: Diagnosis not present

## 2023-04-15 DIAGNOSIS — Z1152 Encounter for screening for COVID-19: Secondary | ICD-10-CM | POA: Insufficient documentation

## 2023-04-15 DIAGNOSIS — R1013 Epigastric pain: Secondary | ICD-10-CM | POA: Diagnosis not present

## 2023-04-15 DIAGNOSIS — N12 Tubulo-interstitial nephritis, not specified as acute or chronic: Secondary | ICD-10-CM | POA: Diagnosis not present

## 2023-04-15 DIAGNOSIS — K7689 Other specified diseases of liver: Secondary | ICD-10-CM | POA: Diagnosis not present

## 2023-04-15 LAB — CBC WITH DIFFERENTIAL/PLATELET
Abs Immature Granulocytes: 0.05 10*3/uL (ref 0.00–0.07)
Basophils Absolute: 0 10*3/uL (ref 0.0–0.1)
Basophils Relative: 0 %
Eosinophils Absolute: 0 10*3/uL (ref 0.0–0.5)
Eosinophils Relative: 0 %
HCT: 35.4 % — ABNORMAL LOW (ref 36.0–46.0)
Hemoglobin: 11.6 g/dL — ABNORMAL LOW (ref 12.0–15.0)
Immature Granulocytes: 0 %
Lymphocytes Relative: 5 %
Lymphs Abs: 0.6 10*3/uL — ABNORMAL LOW (ref 0.7–4.0)
MCH: 28.3 pg (ref 26.0–34.0)
MCHC: 32.8 g/dL (ref 30.0–36.0)
MCV: 86.3 fL (ref 80.0–100.0)
Monocytes Absolute: 0.9 10*3/uL (ref 0.1–1.0)
Monocytes Relative: 7 %
Neutro Abs: 11.3 10*3/uL — ABNORMAL HIGH (ref 1.7–7.7)
Neutrophils Relative %: 88 %
Platelets: 199 10*3/uL (ref 150–400)
RBC: 4.1 MIL/uL (ref 3.87–5.11)
RDW: 17.2 % — ABNORMAL HIGH (ref 11.5–15.5)
WBC: 12.9 10*3/uL — ABNORMAL HIGH (ref 4.0–10.5)
nRBC: 0 % (ref 0.0–0.2)

## 2023-04-15 LAB — URINALYSIS, ROUTINE W REFLEX MICROSCOPIC
Bilirubin Urine: NEGATIVE
Glucose, UA: NEGATIVE mg/dL
Ketones, ur: 20 mg/dL — AB
Nitrite: POSITIVE — AB
Protein, ur: 100 mg/dL — AB
Specific Gravity, Urine: 1.025 (ref 1.005–1.030)
WBC, UA: >50 WBC/hpf (ref 0–5)
pH: 5 (ref 5.0–8.0)

## 2023-04-15 LAB — BASIC METABOLIC PANEL
Anion gap: 11 (ref 5–15)
BUN: 16 mg/dL (ref 6–20)
CO2: 21 mmol/L — ABNORMAL LOW (ref 22–32)
Calcium: 8.8 mg/dL — ABNORMAL LOW (ref 8.9–10.3)
Chloride: 101 mmol/L (ref 98–111)
Creatinine, Ser: 0.79 mg/dL (ref 0.44–1.00)
GFR, Estimated: >60 mL/min (ref >60)
Glucose, Bld: 116 mg/dL — ABNORMAL HIGH (ref 70–99)
Potassium: 2.8 mmol/L — ABNORMAL LOW (ref 3.5–5.1)
Sodium: 133 mmol/L — ABNORMAL LOW (ref 135–145)

## 2023-04-15 LAB — RESP PANEL BY RT-PCR (RSV, FLU A&B, COVID)  RVPGX2
Influenza A by PCR: NEGATIVE
Influenza B by PCR: NEGATIVE
Resp Syncytial Virus by PCR: NEGATIVE
SARS Coronavirus 2 by RT PCR: NEGATIVE

## 2023-04-15 LAB — HEPATIC FUNCTION PANEL
ALT: 11 U/L (ref 0–44)
AST: 19 U/L (ref 15–41)
Albumin: 4 g/dL (ref 3.5–5.0)
Alkaline Phosphatase: 58 U/L (ref 38–126)
Bilirubin, Direct: 0.2 mg/dL (ref 0.0–0.2)
Indirect Bilirubin: 0.7 mg/dL (ref 0.3–0.9)
Total Bilirubin: 0.9 mg/dL (ref <1.2)
Total Protein: 8.6 g/dL — ABNORMAL HIGH (ref 6.5–8.1)

## 2023-04-15 LAB — LIPASE, BLOOD: Lipase: 21 U/L (ref 11–51)

## 2023-04-15 LAB — PREGNANCY, URINE: Preg Test, Ur: NEGATIVE

## 2023-04-15 MED ORDER — PROCHLORPERAZINE EDISYLATE 10 MG/2ML IJ SOLN: 5.0000 mg | Freq: Once | INTRAMUSCULAR | Status: DC

## 2023-04-15 MED ORDER — PROCHLORPERAZINE MALEATE 10 MG PO TABS
10.0000 mg | ORAL_TABLET | Freq: Once | ORAL | Status: AC
  Administered 2023-04-15: 10 mg via ORAL
  Filled 2023-04-15: qty 1

## 2023-04-15 MED ORDER — SODIUM CHLORIDE 0.9 % IV SOLN
1.0000 g | Freq: Once | INTRAVENOUS | Status: AC
  Administered 2023-04-15: 1 g via INTRAVENOUS
  Filled 2023-04-15: qty 10

## 2023-04-15 MED ORDER — ONDANSETRON HCL 4 MG PO TABS: 4.0000 mg | ORAL_TABLET | Freq: Three times a day (TID) | ORAL | 0 refills | Status: DC | PRN

## 2023-04-15 MED ORDER — IOHEXOL 300 MG/ML  SOLN
100.0000 mL | Freq: Once | INTRAMUSCULAR | Status: AC | PRN
  Administered 2023-04-15: 100 mL via INTRAVENOUS

## 2023-04-15 MED ORDER — CEFPODOXIME PROXETIL 200 MG PO TABS: 200.0000 mg | ORAL_TABLET | Freq: Two times a day (BID) | ORAL | 0 refills | Status: AC

## 2023-04-15 MED ORDER — POTASSIUM CHLORIDE CRYS ER 20 MEQ PO TBCR
40.0000 meq | EXTENDED_RELEASE_TABLET | Freq: Once | ORAL | Status: AC
  Administered 2023-04-15: 40 meq via ORAL
  Filled 2023-04-15: qty 2

## 2023-04-15 MED ORDER — OXYCODONE HCL 5 MG PO TABS: 5.0000 mg | ORAL_TABLET | Freq: Three times a day (TID) | ORAL | 0 refills | Status: DC | PRN

## 2023-04-15 MED ORDER — MORPHINE SULFATE (PF) 4 MG/ML IV SOLN
4.0000 mg | Freq: Once | INTRAVENOUS | Status: AC
  Administered 2023-04-15: 4 mg via INTRAVENOUS
  Filled 2023-04-15: qty 1

## 2023-04-15 MED ORDER — IBUPROFEN 800 MG PO TABS
800.0000 mg | ORAL_TABLET | Freq: Once | ORAL | Status: AC
  Administered 2023-04-15: 800 mg via ORAL
  Filled 2023-04-15: qty 1

## 2023-04-15 NOTE — Discharge Instructions (Addendum)
I have sent antibiotics to take as scheduled.  I have also sent pain medicine, and nausea medication to your pharmacy for you to take as needed.  Please return for any worsening symptoms.

## 2023-04-15 NOTE — ED Triage Notes (Signed)
Patient to ED via POV for intermittent migraine x3 days with light sensativity. Hx of same. Also having hot and cold flashes. Took Excedrin 1hr ago.

## 2023-04-15 NOTE — ED Provider Notes (Signed)
[facility] Provider Note    Event Date/Time   First MD Initiated Contact with Patient 04/15/23 1808     (approximate)   History   Migraine   HPI Stephanie Navarro is a 27 y.o. female presenting today for fever, body aches, abdominal pain, and headache.  Patient states over the past 3 to 4 days she has had intermittent headache symptoms.  Have resolved with Excedrin.  She has also had some bodyaches as well as nasal congestion.  Also noted vague abdominal pain with nausea.  Denies chest pain, neck pain, confusion, sore throat, chest pain, shortness of breath, dysuria, hematuria.  No recent sick contacts.     Physical Exam   Triage Vital Signs: ED Triage Vitals [04/15/23 1614]  Encounter Vitals Group     BP 118/68     Systolic BP Percentile      Diastolic BP Percentile      Pulse Rate (!) 109     Resp 18     Temp (!) 103 F (39.4 C)     Temp Source Oral     SpO2 100 %     Weight 155 lb (70.3 kg)     Height 5\' 3"  (1.6 m)     Head Circumference      Peak Flow      Pain Score 10     Pain Loc      Pain Education      Exclude from Growth Chart     Most recent vital signs: Vitals:   04/15/23 2111 04/15/23 2229  BP: 110/66   Pulse: 77 72  Resp: 15 14  Temp: 98.8 F (37.1 C)   SpO2:  98%   Physical Exam: I have reviewed the vital signs and nursing notes. General: Awake, alert, no acute distress.  Nontoxic appearing. Head:  Atraumatic, normocephalic.   ENT:  EOM intact, PERRL. Oral mucosa is pink and moist with no lesions. Neck: Neck is supple with full range of motion, No meningeal signs.  No tenderness to the neck or pain with movement of the neck. Cardiovascular:  RRR, No murmurs. Peripheral pulses palpable and equal bilaterally. Respiratory:  Symmetrical chest wall expansion.  No rhonchi, rales, or wheezes.  Good air movement throughout.  No use of accessory muscles.   Musculoskeletal:  No cyanosis or edema. Moving extremities with full  ROM Abdomen:  Soft, tenderness to palpation in right upper quadrant, nondistended. Neuro:  GCS 15, moving all four extremities, interacting appropriately. Speech clear. Psych:  Calm, appropriate.   Skin:  Warm, dry, no rash.    ED Results / Procedures / Treatments   Labs (all labs ordered are listed, but only abnormal results are displayed) Labs Reviewed  CBC WITH DIFFERENTIAL/PLATELET - Abnormal; Notable for the following components:      Result Value   WBC 12.9 (*)    Hemoglobin 11.6 (*)    HCT 35.4 (*)    RDW 17.2 (*)    Neutro Abs 11.3 (*)    Lymphs Abs 0.6 (*)    All other components within normal limits  BASIC METABOLIC PANEL - Abnormal; Notable for the following components:   Sodium 133 (*)    Potassium 2.8 (*)    CO2 21 (*)    Glucose, Bld 116 (*)    Calcium 8.8 (*)    All other components within normal limits  URINALYSIS, ROUTINE W REFLEX MICROSCOPIC - Abnormal; Notable for the following components:   Color,  Urine YELLOW (*)    APPearance CLOUDY (*)    Hgb urine dipstick SMALL (*)    Ketones, ur 20 (*)    Protein, ur 100 (*)    Nitrite POSITIVE (*)    Leukocytes,Ua LARGE (*)    Bacteria, UA RARE (*)    All other components within normal limits  HEPATIC FUNCTION PANEL - Abnormal; Notable for the following components:   Total Protein 8.6 (*)    All other components within normal limits  RESP PANEL BY RT-PCR (RSV, FLU A&B, COVID)  RVPGX2  PREGNANCY, URINE  LIPASE, BLOOD     EKG    RADIOLOGY Independently interpreted imaging was concern for right-sided pyelonephritis   PROCEDURES:  Critical Care performed: No  Procedures   MEDICATIONS ORDERED IN ED: Medications  cefTRIAXone (ROCEPHIN) 1 g in sodium chloride 0.9 % 100 mL IVPB (has no administration in time range)  ibuprofen (ADVIL) tablet 800 mg (800 mg Oral Given 04/15/23 1622)  prochlorperazine (COMPAZINE) tablet 10 mg (10 mg Oral Given 04/15/23 1842)  morphine (PF) 4 MG/ML injection 4 mg (4 mg  Intravenous Given 04/15/23 2154)  iohexol (OMNIPAQUE) 300 MG/ML solution 100 mL (100 mLs Intravenous Contrast Given 04/15/23 2205)     IMPRESSION / MDM / ASSESSMENT AND PLAN / ED COURSE  I reviewed the triage vital signs and the nursing notes.                              Differential diagnosis includes, but is not limited to, UTI, pyelonephritis, cholecystitis, biliary colic  Patient's presentation is most consistent with acute complicated illness / injury requiring diagnostic workup.  Patient is a 27 year old female presenting today for fever, headache, abdominal pain, nausea.  Febrile on arrival with mild tachycardia.  Physical exam largely reassuring.  No overt meningeal signs and very low suspicion for meningitis with patient otherwise feeling well and symptoms present for several days now.  Laboratory workup notable for mild leukocytosis at 12.9.  BMP with mild hyponatremia and hypokalemia.  Patient was given oral potassium repletion.  Hepatic function normal and lipase normal.  Negative respiratory panel and negative pregnancy test.  UA was positive for UTI.  Given abdominal pain complaints in the right upper quadrant, ultrasound was performed.  This showed no acute pathology.  Follow-up imaging with CT abdomen/pelvis was positive for pyelonephritis.  Patient was given ceftriaxone in the emergency department.  Resolution of fever and tachycardia.  Patient feeling much better and tolerating p.o.  At this time, safe for outpatient treatment of pyelonephritis.  Antibiotics, pain medicine, nausea medication was sent to her pharmacy.  Patient given strict return precautions for worsening symptoms.  The patient is on the cardiac monitor to evaluate for evidence of arrhythmia and/or significant heart rate changes. Clinical Course as of 04/15/23 2302  Tue Apr 15, 2023  1910 Urinalysis, Routine w reflex microscopic -Urine, Clean Catch(!) Positive UTI as well as ketones present likely representing  some dehydration. [DW]  2055 US ABDOMEN LIMITED RUQ (LIVER/GB) No acute pathology [DW]  2254 CT ABDOMEN PELVIS W CONTRAST Positive for pyelonephritis.  Patient otherwise stable at this time.  Will discharge with antibiotics. [DW]  2257 Reassessed patient.  Feeling well and tolerating p.o.  Will discharge. [DW]    Clinical Course User Index [DW] Janith Lima, MD     FINAL CLINICAL IMPRESSION(S) / ED DIAGNOSES   Final diagnoses:  Pyelonephritis     Rx /  DC Orders   ED Discharge Orders          Ordered    cefpodoxime (VANTIN) 200 MG tablet  2 times daily        04/15/23 2301    oxyCODONE (ROXICODONE) 5 MG immediate release tablet  Every 8 hours PRN        04/15/23 2301    ondansetron (ZOFRAN) 4 MG tablet  Every 8 hours PRN        04/15/23 2301             Note:  This document was prepared using Dragon voice recognition software and may include unintentional dictation errors.   Janith Lima, MD 04/15/23 [id number]

## 2023-04-16 ENCOUNTER — Telehealth: Payer: Self-pay | Admitting: *Deleted

## 2023-04-16 NOTE — Telephone Encounter (Signed)
  Chief Complaint: Medication Symptoms: NA Frequency: NA Pertinent Negatives: Patient denies NA Disposition: [] ED /[] Urgent Care (no appt availability in office) / [] Appointment(In office/virtual)/ []  [facility]/ [] Home Care/ [] Refused Recommended Disposition /[] [facility]/ []  Follow-up with PCP Additional Notes:  Pt seen in ED yesterday. States still insevere pain, meds were not sent to pharmacy. Advised to return to ED for level of pain and meds. Pt verbalizes understanding. No PCP

## 2023-05-31 IMAGING — MR MR [PERSON_NAME] HEAD
1 of 4 series · 26 of 48 positions shown · non-contrast
Comparison: Prior head CT from 01/06/2020.
COMPARISON: Prior head CT from 01/06/2020.

Addendum:
CLINICAL DATA: Initial evaluation for approximately 1 year of
headaches, daily in nature, associated blurry vision.

EXAM:
MRI HEAD WITHOUT CONTRAST
MRA HEAD WITHOUT CONTRAST
MRV VENOGRAM WITHOUT CONTRAST
TECHNIQUE: Multiplanar, multi-echo pulse sequences of the brain and surrounding
structures were acquired without intravenous contrast. Angiographic
images of the Circle of Willis were acquired using MRA technique
without intravenous contrast. Additional dedicated MRV images using
the standard protocol were obtained.

[Series 14: TOF · coronal · 2.5mm · 0.98mm/px · 26 of 127 slices shown]
[im 1/127]
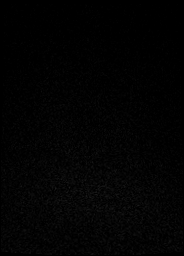
[im 6/127]
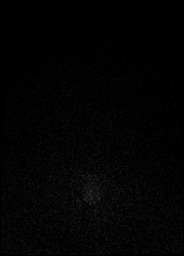
[im 11/127]
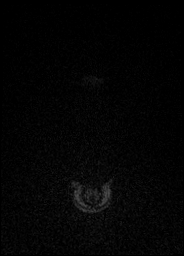
[im 16/127]
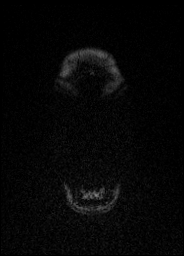
[im 21/127]
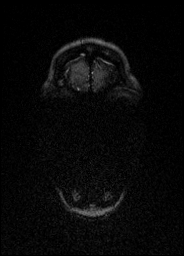
[im 26/127]
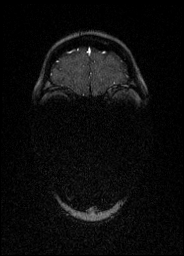
[im 31/127]
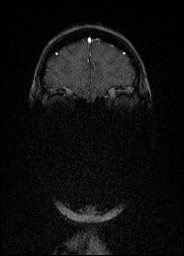
[im 36/127]
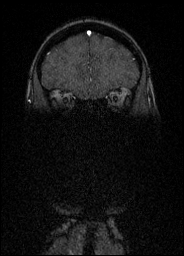
[im 41/127]
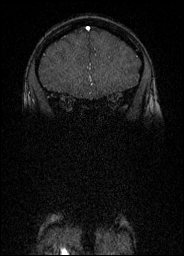
[im 46/127]
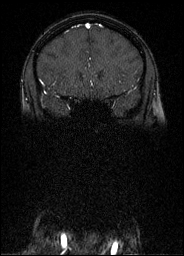
[im 51/127]
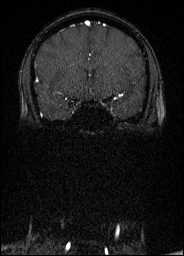
[im 56/127]
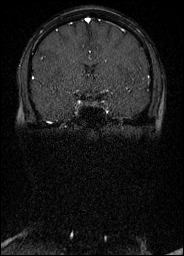
[im 61/127]
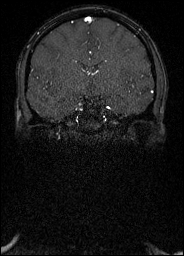
[im 66/127]
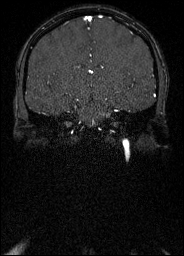
[im 71/127]
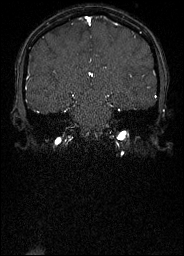
[im 76/127]
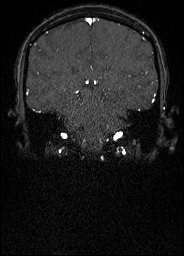
[im 81/127]
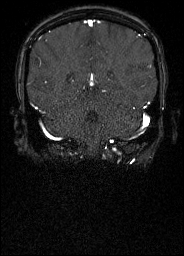
[im 86/127]
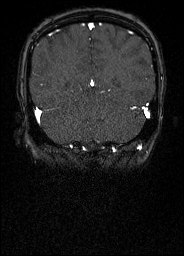
[im 91/127]
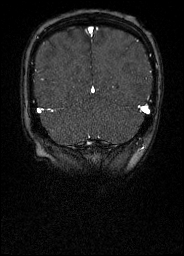
[im 96/127]
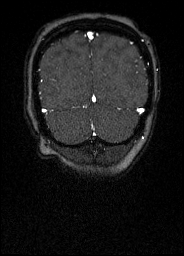
[im 101/127]
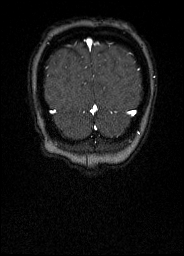
[im 106/127]
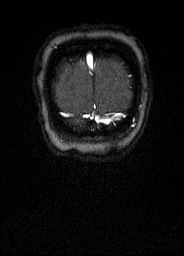
[im 111/127]
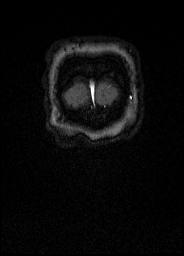
[im 116/127]
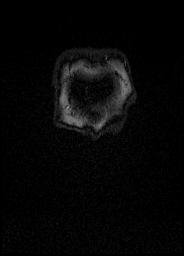
[im 121/127]
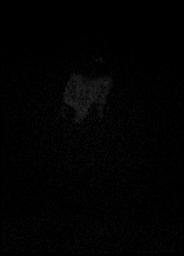
[im 127/127]
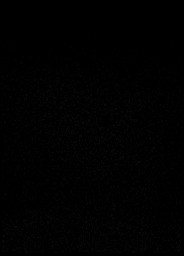

[26 of 48 positions shown; findings below may reference images not displayed]

FINDINGS: MRI HEAD FINDINGS

Brain: Cerebral volume within normal limits for patient age. No
focal parenchymal signal abnormality identified.

No abnormal foci of restricted diffusion to suggest acute or
subacute ischemia. Gray-white matter differentiation well
maintained. No encephalomalacia to suggest chronic infarction. No
foci of susceptibility artifact to suggest acute or chronic
intracranial hemorrhage.

No mass lesion, midline shift or mass effect. No hydrocephalus. No
extra-axial fluid collection. Major dural sinuses are grossly
patent.

Pituitary gland and suprasellar region are normal. Incidental note
made of a 9 mm simple cyst within the pineal gland, felt to be
incidental in nature and of doubtful significance. Midline
structures otherwise intact and normally formed.

Vascular: Major intracranial vascular flow voids well maintained and
normal in appearance.

Skull and upper cervical spine: Craniocervical junction normal.
Visualized upper cervical spine within normal limits. Bone marrow
signal intensity normal. No scalp soft tissue abnormality.

Sinuses/Orbits: Globes and orbital soft tissues within normal
limits.

Moderate mucosal thickening present throughout the ethmoidal air
cells and maxillary sinuses with small amount of pneumatized
secretions within the right maxillary sinus. Paranasal sinuses are
otherwise clear. No mastoid effusion. Inner ear structures grossly
normal.

Other: None.

MRA HEAD FINDINGS

Anterior circulation: Both internal carotid arteries widely patent
to the termini without stenosis. A1 segments widely patent. Normal
anterior communicating artery complex. Both anterior cerebral
arteries widely patent to their distal aspects without stenosis. No
M1 stenosis or occlusion. Normal MCA bifurcations. Distal MCA
branches well perfused and symmetric.

Posterior circulation: Both V4 segments patent to the
vertebrobasilar junction without stenosis. Both PICA origins patent
and normal. Basilar widely patent to its distal aspect without
stenosis. Superior cerebellar arteries patent bilaterally. Both PCAs
primarily supplied via the basilar and are well perfused to there
distal aspects.

Anatomic variants: None significant. No intracranial aneurysm or
other vascular malformation.

MRV HEAD FINDINGS

Normal flow related signal seen throughout the superior sagittal
sinus to the torcula. Transverse and sigmoid sinuses are patent as
are the visualized proximal internal jugular veins. Straight sinus,
vein of Rasika, internal cerebral veins, and basal veins of Don Lolito
appear patent. No evidence for dural sinus thrombosis. No dural
sinus stenosis.
IMPRESSION: 1. Normal MRI of the brain. No findings to explain patient's
symptoms identified.
2. Normal intracranial MRA.  No intracranial aneurysm.
3. Normal intracranial MRV. No evidence for dural sinus thrombosis.

ADDENDUM:
In addition to the initially described findings, note is also made
of moderate inflammatory ethmoidal and maxillary sinusitis. This is
described in the findings portion of the initial report, but omitted
from the impression by mistake.

*** End of Addendum ***
FINDINGS: MRI HEAD FINDINGS

Brain: Cerebral volume within normal limits for patient age. No
focal parenchymal signal abnormality identified.

No abnormal foci of restricted diffusion to suggest acute or
subacute ischemia. Gray-white matter differentiation well
maintained. No encephalomalacia to suggest chronic infarction. No
foci of susceptibility artifact to suggest acute or chronic
intracranial hemorrhage.

No mass lesion, midline shift or mass effect. No hydrocephalus. No
extra-axial fluid collection. Major dural sinuses are grossly
patent.

Pituitary gland and suprasellar region are normal. Incidental note
made of a 9 mm simple cyst within the pineal gland, felt to be
incidental in nature and of doubtful significance. Midline
structures otherwise intact and normally formed.

Vascular: Major intracranial vascular flow voids well maintained and
normal in appearance.

Skull and upper cervical spine: Craniocervical junction normal.
Visualized upper cervical spine within normal limits. Bone marrow
signal intensity normal. No scalp soft tissue abnormality.

Sinuses/Orbits: Globes and orbital soft tissues within normal
limits.

Moderate mucosal thickening present throughout the ethmoidal air
cells and maxillary sinuses with small amount of pneumatized
secretions within the right maxillary sinus. Paranasal sinuses are
otherwise clear. No mastoid effusion. Inner ear structures grossly
normal.

Other: None.

MRA HEAD FINDINGS

Anterior circulation: Both internal carotid arteries widely patent
to the termini without stenosis. A1 segments widely patent. Normal
anterior communicating artery complex. Both anterior cerebral
arteries widely patent to their distal aspects without stenosis. No
M1 stenosis or occlusion. Normal MCA bifurcations. Distal MCA
branches well perfused and symmetric.

Posterior circulation: Both V4 segments patent to the
vertebrobasilar junction without stenosis. Both PICA origins patent
and normal. Basilar widely patent to its distal aspect without
stenosis. Superior cerebellar arteries patent bilaterally. Both PCAs
primarily supplied via the basilar and are well perfused to there
distal aspects.

Anatomic variants: None significant. No intracranial aneurysm or
other vascular malformation.

MRV HEAD FINDINGS

Normal flow related signal seen throughout the superior sagittal
sinus to the torcula. Transverse and sigmoid sinuses are patent as
are the visualized proximal internal jugular veins. Straight sinus,
vein of Rasika, internal cerebral veins, and basal veins of Don Lolito
appear patent. No evidence for dural sinus thrombosis. No dural
sinus stenosis.
IMPRESSION: 1. Normal MRI of the brain. No findings to explain patient's
symptoms identified.
2. Normal intracranial MRA.  No intracranial aneurysm.
3. Normal intracranial MRV. No evidence for dural sinus thrombosis.

## 2023-05-31 IMAGING — MR MR MRA HEAD W/O CM
1 series · 21 of 48 positions shown · non-contrast
Comparison: Prior head CT from 01/06/2020.
COMPARISON: Prior head CT from 01/06/2020.

Addendum:
CLINICAL DATA: Initial evaluation for approximately 1 year of
headaches, daily in nature, associated blurry vision.

EXAM:
MRI HEAD WITHOUT CONTRAST
MRA HEAD WITHOUT CONTRAST
MRV VENOGRAM WITHOUT CONTRAST
TECHNIQUE: Multiplanar, multi-echo pulse sequences of the brain and surrounding
structures were acquired without intravenous contrast. Angiographic
images of the Circle of Willis were acquired using MRA technique
without intravenous contrast. Additional dedicated MRV images using
the standard protocol were obtained.

[Series 9: TOF · axial · 0.5mm · 0.48mm/px · z∈[-81,+5]mm · 21 of 217 slices shown]
[im 1/217]
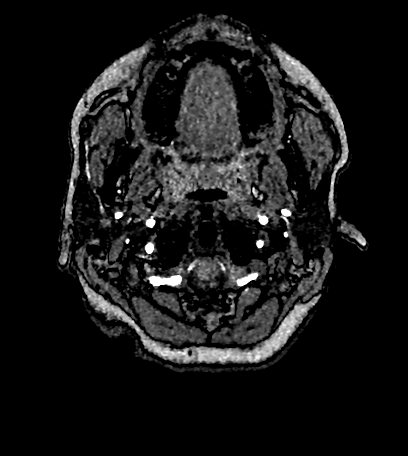
[im 5/217]
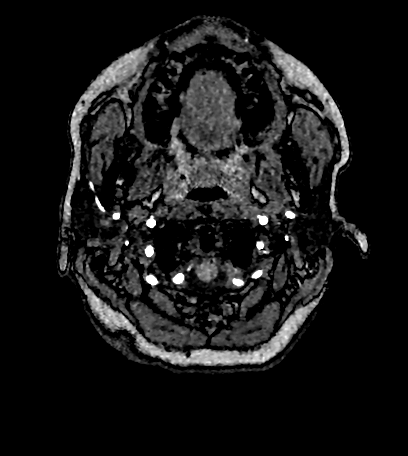
[im 10/217]
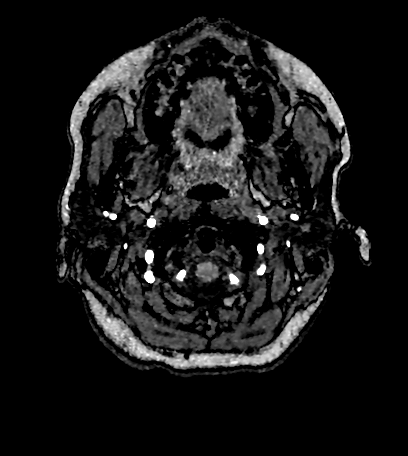
[im 14/217]
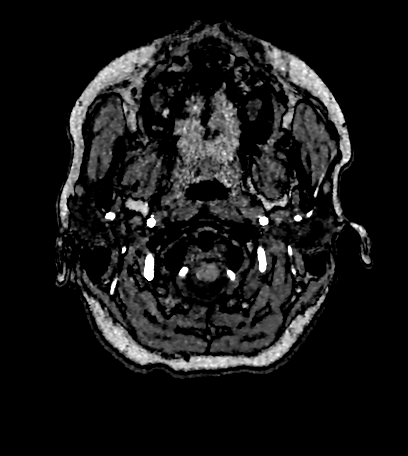
[im 19/217]
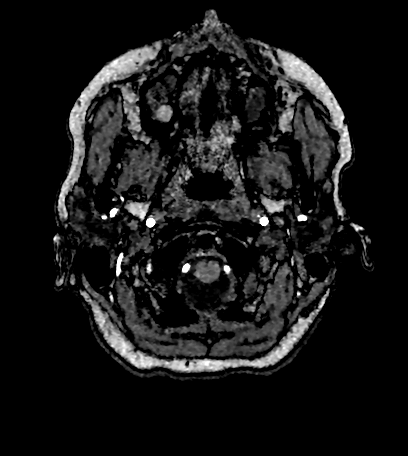
[im 23/217]
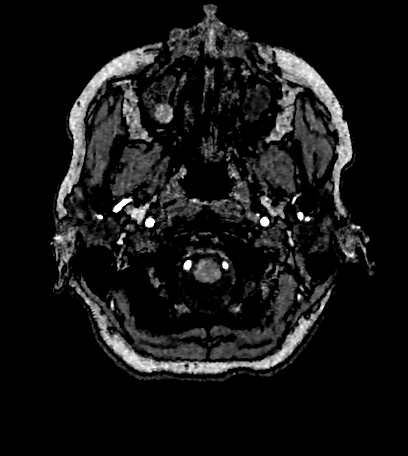
[im 28/217]
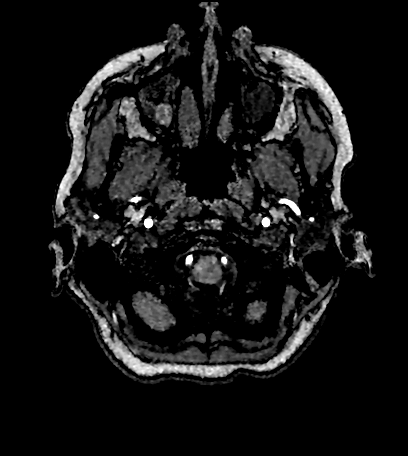
[im 33/217]
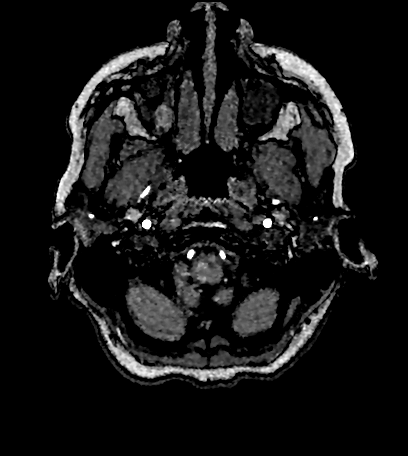
[im 37/217]
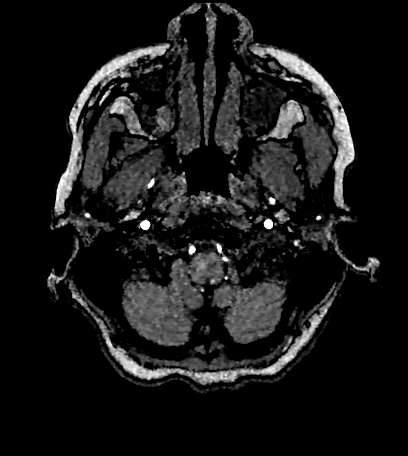
[im 42/217]
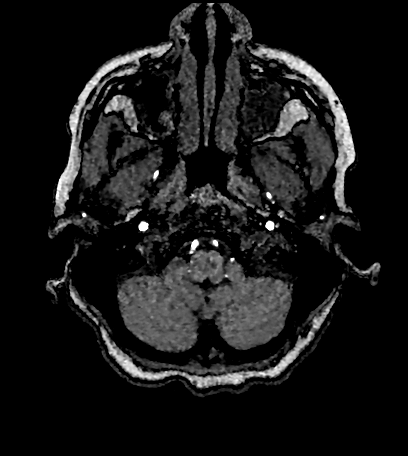
[im 46/217]
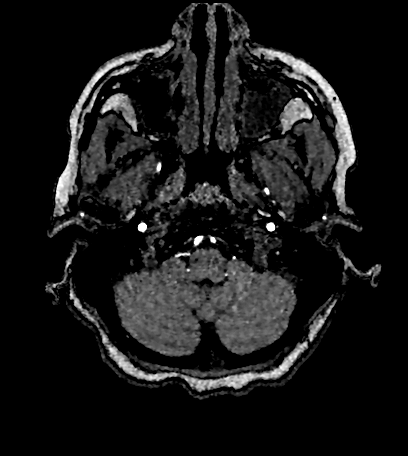
[im 51/217]
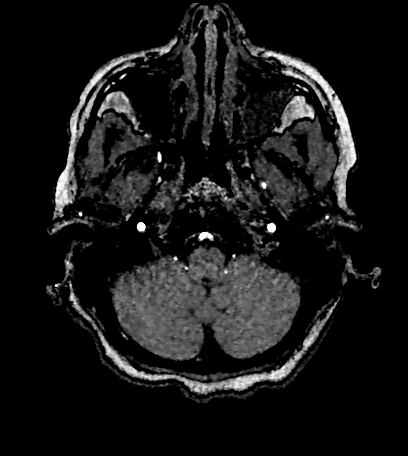
[im 56/217]
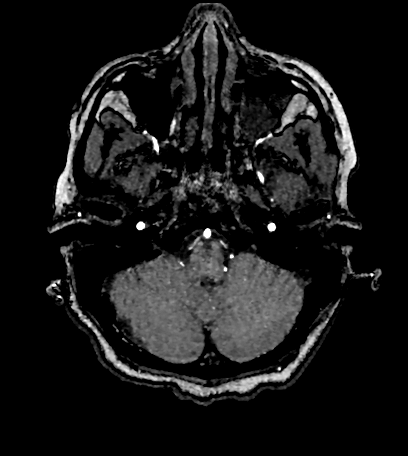
[im 69/217]
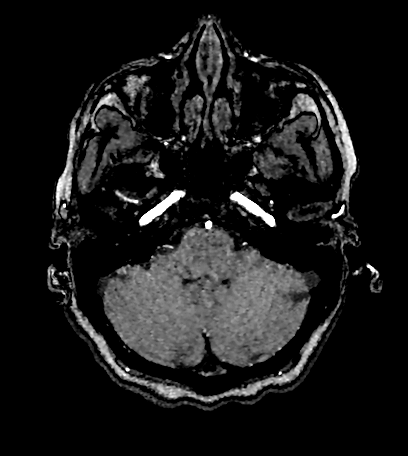
[im 97/217]
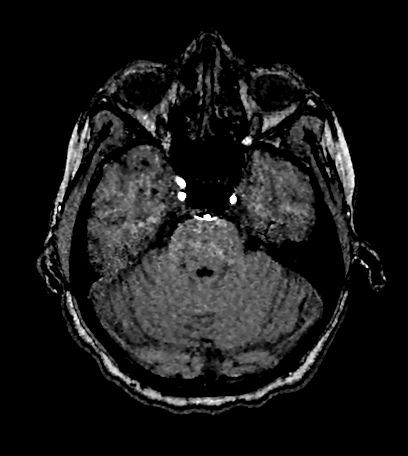
[im 111/217]
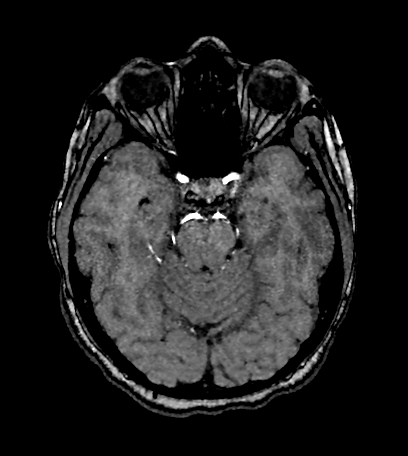
[im 125/217]
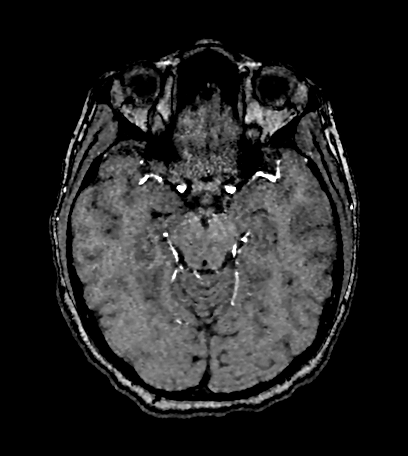
[im 152/217]
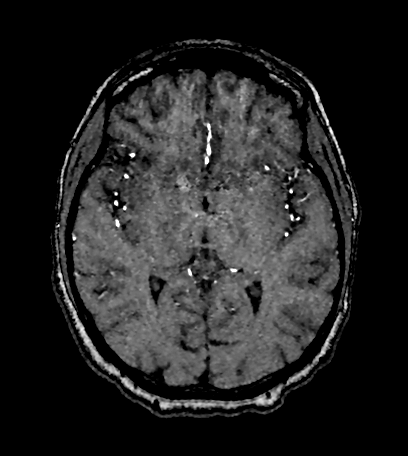
[im 180/217]
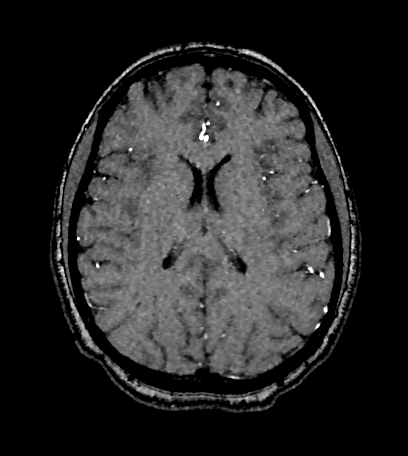
[im 184/217]
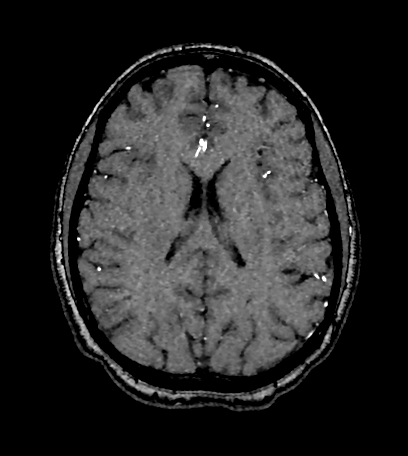
[im 207/217]
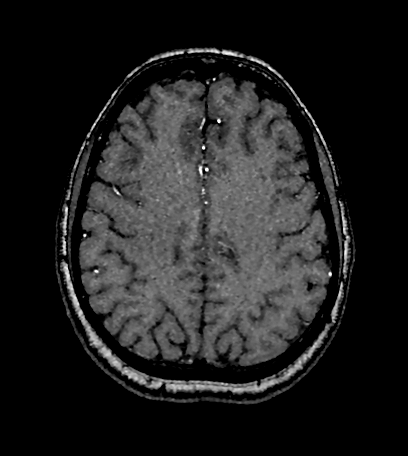

[21 of 48 positions shown; findings below may reference images not displayed]

FINDINGS: MRI HEAD FINDINGS

Brain: Cerebral volume within normal limits for patient age. No
focal parenchymal signal abnormality identified.

No abnormal foci of restricted diffusion to suggest acute or
subacute ischemia. Gray-white matter differentiation well
maintained. No encephalomalacia to suggest chronic infarction. No
foci of susceptibility artifact to suggest acute or chronic
intracranial hemorrhage.

No mass lesion, midline shift or mass effect. No hydrocephalus. No
extra-axial fluid collection. Major dural sinuses are grossly
patent.

Pituitary gland and suprasellar region are normal. Incidental note
made of a 9 mm simple cyst within the pineal gland, felt to be
incidental in nature and of doubtful significance. Midline
structures otherwise intact and normally formed.

Vascular: Major intracranial vascular flow voids well maintained and
normal in appearance.

Skull and upper cervical spine: Craniocervical junction normal.
Visualized upper cervical spine within normal limits. Bone marrow
signal intensity normal. No scalp soft tissue abnormality.

Sinuses/Orbits: Globes and orbital soft tissues within normal
limits.

Moderate mucosal thickening present throughout the ethmoidal air
cells and maxillary sinuses with small amount of pneumatized
secretions within the right maxillary sinus. Paranasal sinuses are
otherwise clear. No mastoid effusion. Inner ear structures grossly
normal.

Other: None.

MRA HEAD FINDINGS

Anterior circulation: Both internal carotid arteries widely patent
to the termini without stenosis. A1 segments widely patent. Normal
anterior communicating artery complex. Both anterior cerebral
arteries widely patent to their distal aspects without stenosis. No
M1 stenosis or occlusion. Normal MCA bifurcations. Distal MCA
branches well perfused and symmetric.

Posterior circulation: Both V4 segments patent to the
vertebrobasilar junction without stenosis. Both PICA origins patent
and normal. Basilar widely patent to its distal aspect without
stenosis. Superior cerebellar arteries patent bilaterally. Both PCAs
primarily supplied via the basilar and are well perfused to there
distal aspects.

Anatomic variants: None significant. No intracranial aneurysm or
other vascular malformation.

MRV HEAD FINDINGS

Normal flow related signal seen throughout the superior sagittal
sinus to the torcula. Transverse and sigmoid sinuses are patent as
are the visualized proximal internal jugular veins. Straight sinus,
vein of Rasika, internal cerebral veins, and basal veins of Don Lolito
appear patent. No evidence for dural sinus thrombosis. No dural
sinus stenosis.
IMPRESSION: 1. Normal MRI of the brain. No findings to explain patient's
symptoms identified.
2. Normal intracranial MRA.  No intracranial aneurysm.
3. Normal intracranial MRV. No evidence for dural sinus thrombosis.

ADDENDUM:
In addition to the initially described findings, note is also made
of moderate inflammatory ethmoidal and maxillary sinusitis. This is
described in the findings portion of the initial report, but omitted
from the impression by mistake.

*** End of Addendum ***
FINDINGS: MRI HEAD FINDINGS

Brain: Cerebral volume within normal limits for patient age. No
focal parenchymal signal abnormality identified.

No abnormal foci of restricted diffusion to suggest acute or
subacute ischemia. Gray-white matter differentiation well
maintained. No encephalomalacia to suggest chronic infarction. No
foci of susceptibility artifact to suggest acute or chronic
intracranial hemorrhage.

No mass lesion, midline shift or mass effect. No hydrocephalus. No
extra-axial fluid collection. Major dural sinuses are grossly
patent.

Pituitary gland and suprasellar region are normal. Incidental note
made of a 9 mm simple cyst within the pineal gland, felt to be
incidental in nature and of doubtful significance. Midline
structures otherwise intact and normally formed.

Vascular: Major intracranial vascular flow voids well maintained and
normal in appearance.

Skull and upper cervical spine: Craniocervical junction normal.
Visualized upper cervical spine within normal limits. Bone marrow
signal intensity normal. No scalp soft tissue abnormality.

Sinuses/Orbits: Globes and orbital soft tissues within normal
limits.

Moderate mucosal thickening present throughout the ethmoidal air
cells and maxillary sinuses with small amount of pneumatized
secretions within the right maxillary sinus. Paranasal sinuses are
otherwise clear. No mastoid effusion. Inner ear structures grossly
normal.

Other: None.

MRA HEAD FINDINGS

Anterior circulation: Both internal carotid arteries widely patent
to the termini without stenosis. A1 segments widely patent. Normal
anterior communicating artery complex. Both anterior cerebral
arteries widely patent to their distal aspects without stenosis. No
M1 stenosis or occlusion. Normal MCA bifurcations. Distal MCA
branches well perfused and symmetric.

Posterior circulation: Both V4 segments patent to the
vertebrobasilar junction without stenosis. Both PICA origins patent
and normal. Basilar widely patent to its distal aspect without
stenosis. Superior cerebellar arteries patent bilaterally. Both PCAs
primarily supplied via the basilar and are well perfused to there
distal aspects.

Anatomic variants: None significant. No intracranial aneurysm or
other vascular malformation.

MRV HEAD FINDINGS

Normal flow related signal seen throughout the superior sagittal
sinus to the torcula. Transverse and sigmoid sinuses are patent as
are the visualized proximal internal jugular veins. Straight sinus,
vein of Rasika, internal cerebral veins, and basal veins of Don Lolito
appear patent. No evidence for dural sinus thrombosis. No dural
sinus stenosis.
IMPRESSION: 1. Normal MRI of the brain. No findings to explain patient's
symptoms identified.
2. Normal intracranial MRA.  No intracranial aneurysm.
3. Normal intracranial MRV. No evidence for dural sinus thrombosis.

## 2023-05-31 IMAGING — MR MR HEAD W/O CM
11 of 13 series · 36 of 48 positions shown · non-contrast
Comparison: Prior head CT from 01/06/2020.
COMPARISON: Prior head CT from 01/06/2020.

Addendum:
CLINICAL DATA: Initial evaluation for approximately 1 year of
headaches, daily in nature, associated blurry vision.

EXAM:
MRI HEAD WITHOUT CONTRAST
MRA HEAD WITHOUT CONTRAST
MRV VENOGRAM WITHOUT CONTRAST
TECHNIQUE: Multiplanar, multi-echo pulse sequences of the brain and surrounding
structures were acquired without intravenous contrast. Angiographic
images of the Circle of Willis were acquired using MRA technique
without intravenous contrast. Additional dedicated MRV images using
the standard protocol were obtained.

[Series 5: ax dwi_tracew · axial · 3.0mm · 0.65mm/px · z∈[-88,+55]mm · 4 of 48 slices shown]
[im 1/48]
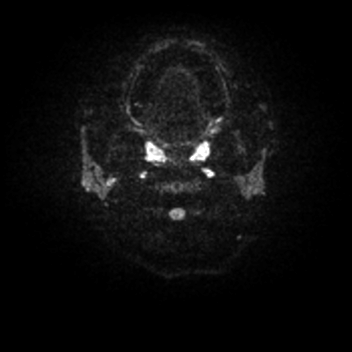
[im 16/48]
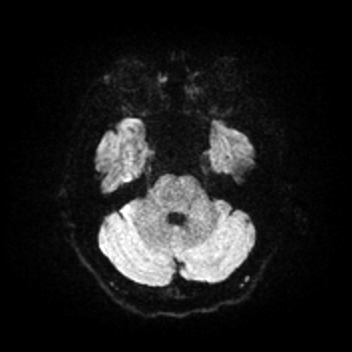
[im 32/48]
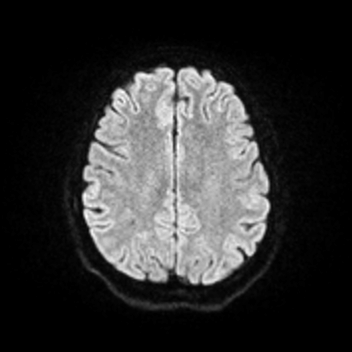
[im 48/48]
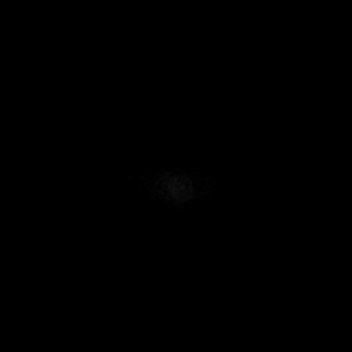

[Series 6: ax dwi_adc · axial · 3.0mm · 0.65mm/px · z∈[-88,+52]mm · 4 of 47 slices shown]
[im 1/47]
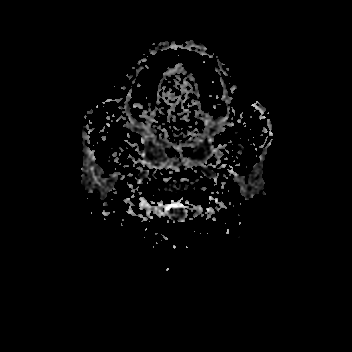
[im 16/47]
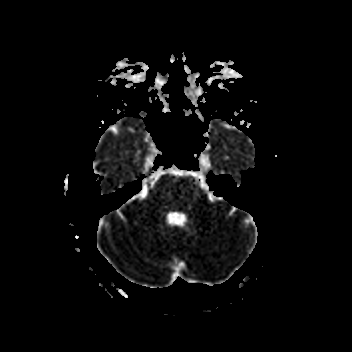
[im 31/47]
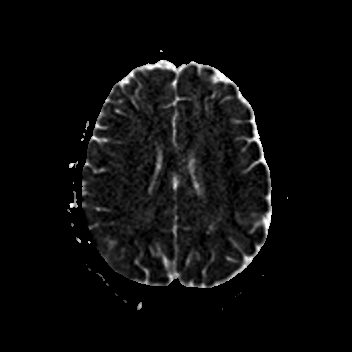
[im 47/47]
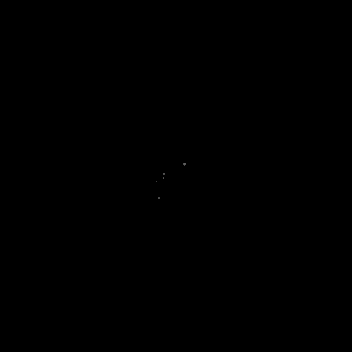

[Series 7: cor dwi_tracew · coronal · 5.0mm · 0.65mm/px · 2 of 40 slices shown]
[im 1/40]
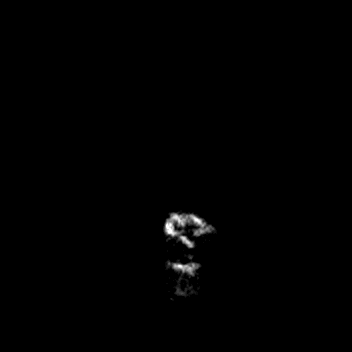
[im 40/40]
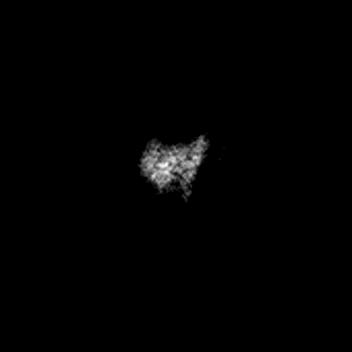

[Series 8: cor dwi_adc · coronal · 5.0mm · 0.65mm/px · 2 of 38 slices shown]
[im 1/38]
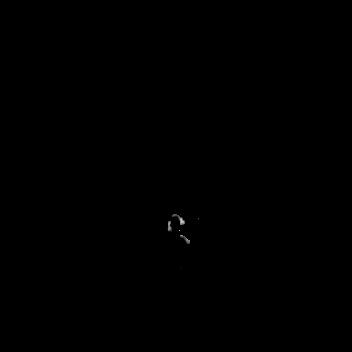
[im 38/38]
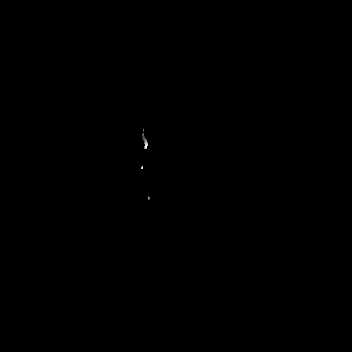

[Series 14: T1 · sagittal · 5.0mm · 0.62mm/px · 1 of 25 slices shown (1 of 3)]
[im 1/25]
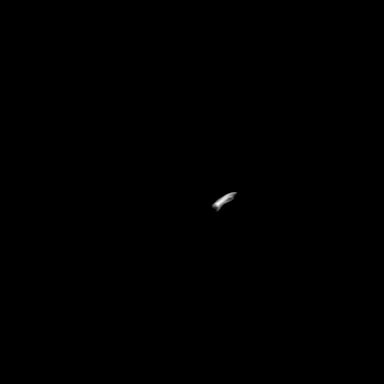

[Series 15: T2 · axial · 5.0mm · 0.53mm/px · 1 of 26 slices shown (1 of 2)]
[im 1/26]
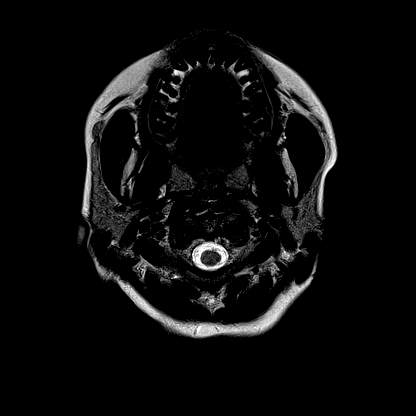

[Series 16: mag_images · axial · 3.0mm · 0.90mm/px · 1 of 60 slices shown]
[im 1/60]
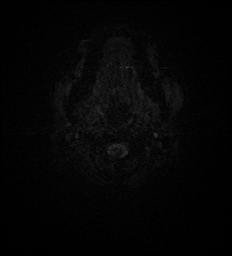

[Series 20: FLAIR · axial · 3.0mm · 0.53mm/px · z∈[-89,+60]mm · 3 of 55 slices shown]
[im 1/55]
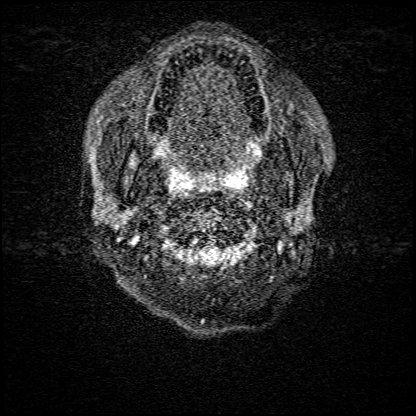
[im 28/55]
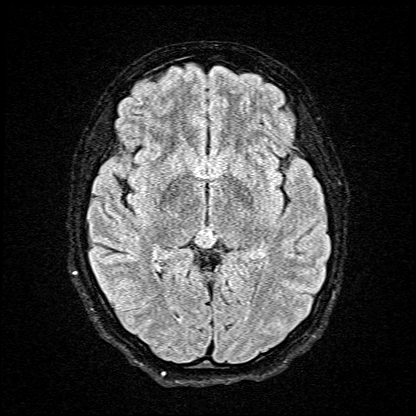
[im 55/55]
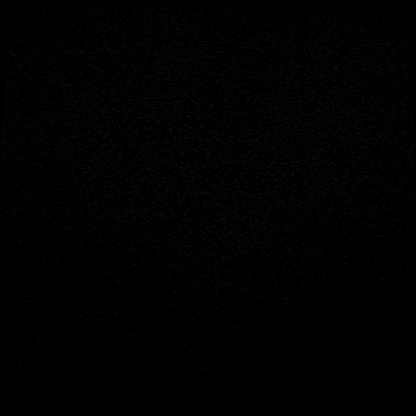

[Series 21: T1 · axial · 1.0mm · 0.98mm/px · z∈[-101,+60]mm · 8 of 176 slices shown (2 of 3)]
[im 1/176]
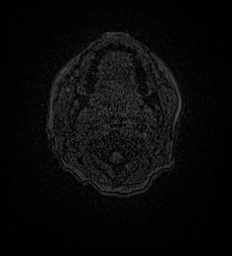
[im 20/176]
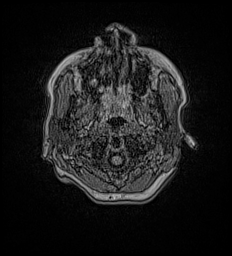
[im 59/176]
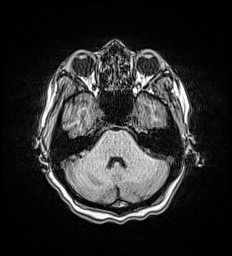
[im 78/176]
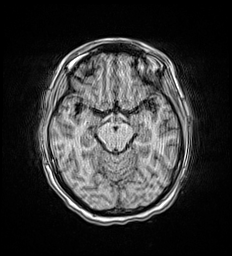
[im 98/176]
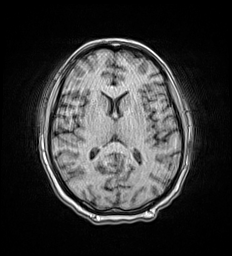
[im 117/176]
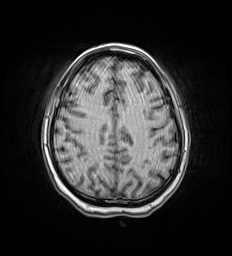
[im 156/176]
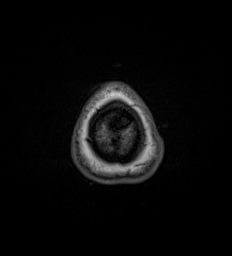
[im 176/176]
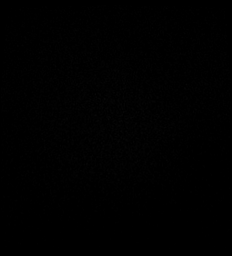

[Series 22: T2 · coronal · 5.0mm · 0.57mm/px · 2 of 29 slices shown (2 of 2)]
[im 1/29]
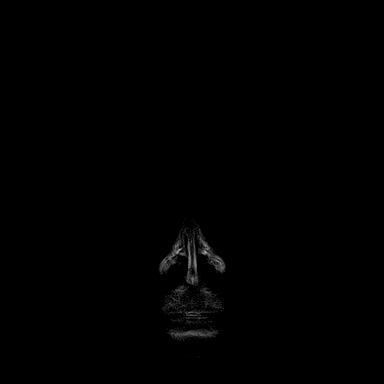
[im 29/29]
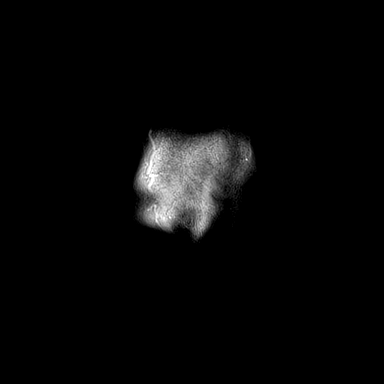

[Series 23: T1 · axial · 1.0mm · 0.98mm/px · z∈[-101,+60]mm · 8 of 176 slices shown (3 of 3)]
[im 1/176]
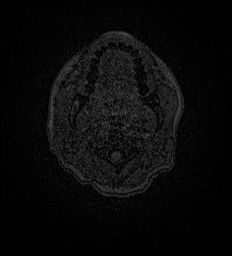
[im 20/176]
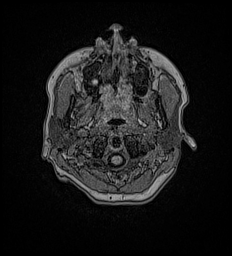
[im 59/176]
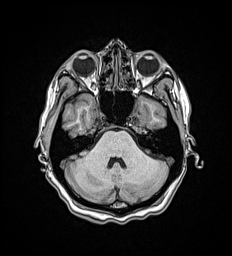
[im 78/176]
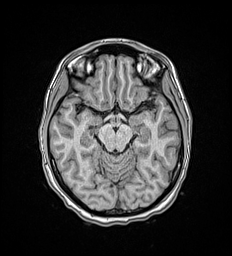
[im 98/176]
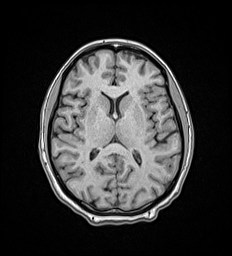
[im 117/176]
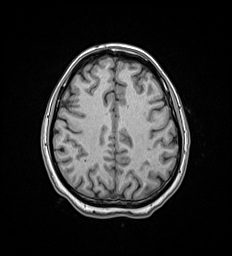
[im 156/176]
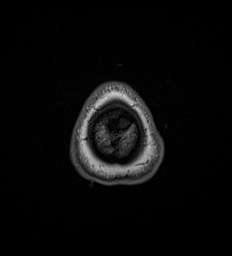
[im 176/176]
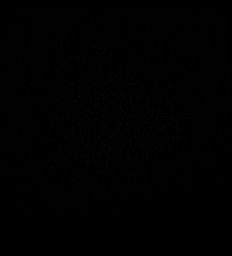

[36 of 48 positions shown; findings below may reference images not displayed]

FINDINGS: MRI HEAD FINDINGS

Brain: Cerebral volume within normal limits for patient age. No
focal parenchymal signal abnormality identified.

No abnormal foci of restricted diffusion to suggest acute or
subacute ischemia. Gray-white matter differentiation well
maintained. No encephalomalacia to suggest chronic infarction. No
foci of susceptibility artifact to suggest acute or chronic
intracranial hemorrhage.

No mass lesion, midline shift or mass effect. No hydrocephalus. No
extra-axial fluid collection. Major dural sinuses are grossly
patent.

Pituitary gland and suprasellar region are normal. Incidental note
made of a 9 mm simple cyst within the pineal gland, felt to be
incidental in nature and of doubtful significance. Midline
structures otherwise intact and normally formed.

Vascular: Major intracranial vascular flow voids well maintained and
normal in appearance.

Skull and upper cervical spine: Craniocervical junction normal.
Visualized upper cervical spine within normal limits. Bone marrow
signal intensity normal. No scalp soft tissue abnormality.

Sinuses/Orbits: Globes and orbital soft tissues within normal
limits.

Moderate mucosal thickening present throughout the ethmoidal air
cells and maxillary sinuses with small amount of pneumatized
secretions within the right maxillary sinus. Paranasal sinuses are
otherwise clear. No mastoid effusion. Inner ear structures grossly
normal.

Other: None.

MRA HEAD FINDINGS

Anterior circulation: Both internal carotid arteries widely patent
to the termini without stenosis. A1 segments widely patent. Normal
anterior communicating artery complex. Both anterior cerebral
arteries widely patent to their distal aspects without stenosis. No
M1 stenosis or occlusion. Normal MCA bifurcations. Distal MCA
branches well perfused and symmetric.

Posterior circulation: Both V4 segments patent to the
vertebrobasilar junction without stenosis. Both PICA origins patent
and normal. Basilar widely patent to its distal aspect without
stenosis. Superior cerebellar arteries patent bilaterally. Both PCAs
primarily supplied via the basilar and are well perfused to there
distal aspects.

Anatomic variants: None significant. No intracranial aneurysm or
other vascular malformation.

MRV HEAD FINDINGS

Normal flow related signal seen throughout the superior sagittal
sinus to the torcula. Transverse and sigmoid sinuses are patent as
are the visualized proximal internal jugular veins. Straight sinus,
vein of Rasika, internal cerebral veins, and basal veins of Don Lolito
appear patent. No evidence for dural sinus thrombosis. No dural
sinus stenosis.
IMPRESSION: 1. Normal MRI of the brain. No findings to explain patient's
symptoms identified.
2. Normal intracranial MRA.  No intracranial aneurysm.
3. Normal intracranial MRV. No evidence for dural sinus thrombosis.

ADDENDUM:
In addition to the initially described findings, note is also made
of moderate inflammatory ethmoidal and maxillary sinusitis. This is
described in the findings portion of the initial report, but omitted
from the impression by mistake.

*** End of Addendum ***
FINDINGS: MRI HEAD FINDINGS

Brain: Cerebral volume within normal limits for patient age. No
focal parenchymal signal abnormality identified.

No abnormal foci of restricted diffusion to suggest acute or
subacute ischemia. Gray-white matter differentiation well
maintained. No encephalomalacia to suggest chronic infarction. No
foci of susceptibility artifact to suggest acute or chronic
intracranial hemorrhage.

No mass lesion, midline shift or mass effect. No hydrocephalus. No
extra-axial fluid collection. Major dural sinuses are grossly
patent.

Pituitary gland and suprasellar region are normal. Incidental note
made of a 9 mm simple cyst within the pineal gland, felt to be
incidental in nature and of doubtful significance. Midline
structures otherwise intact and normally formed.

Vascular: Major intracranial vascular flow voids well maintained and
normal in appearance.

Skull and upper cervical spine: Craniocervical junction normal.
Visualized upper cervical spine within normal limits. Bone marrow
signal intensity normal. No scalp soft tissue abnormality.

Sinuses/Orbits: Globes and orbital soft tissues within normal
limits.

Moderate mucosal thickening present throughout the ethmoidal air
cells and maxillary sinuses with small amount of pneumatized
secretions within the right maxillary sinus. Paranasal sinuses are
otherwise clear. No mastoid effusion. Inner ear structures grossly
normal.

Other: None.

MRA HEAD FINDINGS

Anterior circulation: Both internal carotid arteries widely patent
to the termini without stenosis. A1 segments widely patent. Normal
anterior communicating artery complex. Both anterior cerebral
arteries widely patent to their distal aspects without stenosis. No
M1 stenosis or occlusion. Normal MCA bifurcations. Distal MCA
branches well perfused and symmetric.

Posterior circulation: Both V4 segments patent to the
vertebrobasilar junction without stenosis. Both PICA origins patent
and normal. Basilar widely patent to its distal aspect without
stenosis. Superior cerebellar arteries patent bilaterally. Both PCAs
primarily supplied via the basilar and are well perfused to there
distal aspects.

Anatomic variants: None significant. No intracranial aneurysm or
other vascular malformation.

MRV HEAD FINDINGS

Normal flow related signal seen throughout the superior sagittal
sinus to the torcula. Transverse and sigmoid sinuses are patent as
are the visualized proximal internal jugular veins. Straight sinus,
vein of Rasika, internal cerebral veins, and basal veins of Don Lolito
appear patent. No evidence for dural sinus thrombosis. No dural
sinus stenosis.
IMPRESSION: 1. Normal MRI of the brain. No findings to explain patient's
symptoms identified.
2. Normal intracranial MRA.  No intracranial aneurysm.
3. Normal intracranial MRV. No evidence for dural sinus thrombosis.

## 2023-06-09 ENCOUNTER — Other Ambulatory Visit: Payer: 59

## 2023-06-11 NOTE — L&D Delivery Note (Signed)
 Delivery Note  Stephanie Navarro is a [id number] at [redacted]w[redacted]d with an LMP of 08/15/23, consistent with US  at [redacted]w[redacted]d.   First Stage: Labor onset: 2100 Analgesia Holli intrapartum: Epidural Date/time: 05/11/24@ 1312, Amount: gush, and Color: moderate meconium GBS: Negative/-- (11/17 0000)  IP Antibiotics: abx: none  Second Stage: Complete dilation at 0119 Onset of pushing at 0119 FHR second stage Baseline: 125 bpm, Variability: Good {> 6 bpm), and Decelerations: Variable: mild decels with pushing   [name] presented to L&D for active labor She was 5/90/-2. She progressed  to C/C/+2 with a spontaneous urge to push.  She pushed  effectively over approximately 3 minutes for a spontaneous vaginal birth. Delivery of a viable baby girl on 05/11/2024 . by CNM. Delivery of fetal head in position: Occiput,, Anterior position with restitution to position: Left,, Occiput,, Anterior no nuchal cord;  Anterior then posterior shoulders delivered easily with gentle downward traction. Baby placed on mom's chest, and attended to by baby RN. Cord double clamped after cessation of pulsation, cut by FOB    Third Stage: Oxytocin  bolus started after delivery of placenta for hemorrhage prophylaxis. Placenta delivered Keren intact with 3 VC @ 0129 Placenta disposition:.discarded per protocol To Pathology: No  Uterine tone firm / exam; vaginal bleeding: minimal  Laceration: no laceration identified  Anesthesia for repair: procedures; anesthesia: none Repair suture type: n/a Est. Blood Loss (mL): 100  Complications:Diagnoses; OB-GYN delivery complications: meconium without aspiration  Mom to postpartum.  Baby to Couplet care / Skin to Skin.  Newborn: Information for the patient's newborn:  Teddie, Curd [[id number]]  Live born baby girl [name] Birth Weight:  pending APGAR: 9,9  Newborn Delivery   Birth date/time: 05/11/2024 01:22:00 Delivery type: Vaginal, Spontaneous      Feeding planned:  breast and formula feeding  ---------- [name], CNM Certified Nurse Midwife [facility]

## 2023-07-01 ENCOUNTER — Other Ambulatory Visit: Payer: 59

## 2023-09-29 ENCOUNTER — Ambulatory Visit

## 2023-09-29 VITALS — BP 190/58 | Ht 64.0 in | Wt 150.5 lb

## 2023-09-29 DIAGNOSIS — Z3201 Encounter for pregnancy test, result positive: Secondary | ICD-10-CM | POA: Diagnosis not present

## 2023-09-29 DIAGNOSIS — Z309 Encounter for contraceptive management, unspecified: Secondary | ICD-10-CM | POA: Diagnosis not present

## 2023-09-29 LAB — PREGNANCY, URINE: Preg Test, Ur: POSITIVE — AB

## 2023-09-29 MED ORDER — PRENATAL 27-0.8 MG PO TABS: 1.0000 | ORAL_TABLET | Freq: Every day | ORAL | Status: AC

## 2023-09-29 NOTE — Progress Notes (Signed)
 Client seen in nurse clinic for pregnancy test.  Results positive.  Client reports she has already established cared with [facility] and has an appointment on May 5th for an ultra sound.   Positive Pregnancy Packet provided to client.  Client with no questions.   Client again plan Prenatal Care at [facility], and state she will complete presumptive eligibility at another time   The patient was dispensed Prenatal Vitamins today. I provided counseling today regarding the medication. We discussed the medication, the side effects and when to call clinic. Patient given the opportunity to ask questions. Questions answered.    Philena Obey Sandrea Cruel, RN
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
## 2023-10-29 DIAGNOSIS — O0932 Supervision of pregnancy with insufficient antenatal care, second trimester: Secondary | ICD-10-CM | POA: Insufficient documentation

## 2023-11-11 LAB — OB RESULTS CONSOLE HIV ANTIBODY (ROUTINE TESTING): HIV: NONREACTIVE

## 2023-11-11 LAB — OB RESULTS CONSOLE RUBELLA ANTIBODY, IGM: Rubella: IMMUNE

## 2023-11-11 LAB — HEPATITIS C ANTIBODY: HCV Ab: NEGATIVE

## 2023-11-11 LAB — OB RESULTS CONSOLE ABO/RH

## 2023-11-11 LAB — OB RESULTS CONSOLE HEPATITIS B SURFACE ANTIGEN: Hepatitis B Surface Ag: NEGATIVE

## 2023-11-11 LAB — OB RESULTS CONSOLE VARICELLA ZOSTER ANTIBODY, IGG: Varicella: IMMUNE

## 2023-11-21 LAB — OB RESULTS CONSOLE GC/CHLAMYDIA
Chlamydia: NEGATIVE
Neisseria Gonorrhea: NEGATIVE

## 2024-02-24 NOTE — Progress Notes (Signed)
 Patient's last menstrual period was 08/15/2023 (exact date). Estimated Date of Delivery: 05/21/24  28 y.o. [id number] at [redacted]w[redacted]d  The primary encounter diagnosis was Limited prenatal care in second trimester (HHS-HCC). A diagnosis of Supervision of high risk pregnancy in third trimester (HHS-HCC) was also pertinent to this visit.  S:   Patient concerns today:  - Back pain - needs to rest more  No chief complaint on file.   Reports: back pain  Denies bleeding, contractions, cramping or leaking.   O:   See [facility] flowsheets. BP 104/69   Pulse 88   Ht 162.6 cm (5' 4)   Wt 71.2 kg (157 lb)   LMP 08/15/2023 (Exact Date)   BMI 26.95 kg/m  Gen: NAD  Pulm: No use of accessory muscles, normal respirations Abdomen: Gravid, nontender  Fundal height: 28cm  FHT by doppler (distinguished from mom): 130bpm  S=D Pelvic: SVE deferred Ext : no edema, no rashes Psych: Mood, insight, judgement intact  A/P:  28 y.o. [id number] at [redacted]w[redacted]d   Limited prenatal care in 2nd trimester Gap in care 12.4 weeks-23.4 weeks, states she and kids were sick  Planning BTL postpartum sign consent @ 28wks  - Problem list reviewed and/or updated. - 1h GTT, CBC, UA completed  - Rh: pos - Tdap: declined - Blood consent: reviewed and signed - Contraception: BTL and Nexplanon  - Feeding: both  - Return in about 2 weeks (around 03/16/2024) for Routine OB visit.    Attestation Statement:   I personally performed the service, non-incident to. [facility])   JENNIFER RICHARDSON MYRON DELON MYRON, CNM 03/02/2024 9:41 AM

## 2024-03-02 LAB — OB RESULTS CONSOLE HIV ANTIBODY (ROUTINE TESTING): HIV: NONREACTIVE

## 2024-03-15 NOTE — Progress Notes (Signed)
  [facility] - OBSTETRICS AND GYNECOLOGY    Routine Prenatal Care Visit  Subjective  Stephanie Navarro is a 28 y.o. [phone] at [redacted]w[redacted]d being seen today for ongoing prenatal care.  She is currently monitored for tthis high-risk pregnancy.  ----------------------------------------------------------------------------------- Patient reports no issues.    .  .  Movement: Present. Leaking Fluid denies.  Vaginal Bleeding: denies ----------------------------------------------------------------------------------- The following portions of the patient's history were reviewed and updated as appropriate: allergies, current medications, past family history, past medical history, past social history, past surgical history and problem list. Problem list updated.  Objective  BP 95/65   Pulse 81   Ht 162.6 cm (5' 4)   Wt 72 kg (158 lb 12.8 oz)   LMP 08/15/2023 (Exact Date)   BMI 27.26 kg/m   Pregravid weight Pregravid weight not on file Total Weight Gain Not found. Urinalysis: Urine Protein    Urine Glucose    Fetal Status: Fetal Heart Rate: 125 Fundal Height (cm): 30 cm Movement: Present     General:  Alert, oriented and cooperative. Patient is in no acute distress.  Skin: Skin is warm and dry. No rash noted.   Cardiovascular: Normal heart rate noted  Respiratory: Normal respiratory effort, no problems with respiration noted  Abdomen: Soft, gravid, appropriate for gestational age.       Pelvic:  Cervical exam deferred        Extremities: Normal range of motion.     Mental Status: Normal mood and affect. Normal behavior. Normal judgment and thought content.   Assessment   28 y.o. [id number] at [redacted]w[redacted]d by  05/21/2024, by Last Menstrual Period presenting for routine prenatal visit  Plan   Preterm labor symptoms and general obstetric precautions including but not limited to vaginal bleeding, contractions, leaking of fluid and fetal movement were reviewed in detail with the patient. Please  refer to After Visit Summary for other counseling recommendations.   Return in about 2 weeks (around 03/29/2024) for Routine prenatal.   Attestation Statement:   I personally performed the service. (TP)  STEPHEN TORIBIO MACE, MD  [facility] [place] 03/15/2024 9:17 AM

## 2024-03-28 ENCOUNTER — Encounter: Payer: Self-pay | Admitting: Obstetrics and Gynecology

## 2024-03-28 ENCOUNTER — Observation Stay
Admission: EM | Admit: 2024-03-28 | Discharge: 2024-03-28 | Disposition: A | Discharge status: Home / Self Care | Source: Ambulatory Visit | Attending: Obstetrics | Admitting: Obstetrics

## 2024-03-28 DIAGNOSIS — Z3A32 32 weeks gestation of pregnancy: Secondary | ICD-10-CM | POA: Insufficient documentation

## 2024-03-28 DIAGNOSIS — O26893 Other specified pregnancy related conditions, third trimester: Principal | ICD-10-CM | POA: Insufficient documentation

## 2024-03-28 DIAGNOSIS — F17210 Nicotine dependence, cigarettes, uncomplicated: Secondary | ICD-10-CM | POA: Diagnosis not present

## 2024-03-28 DIAGNOSIS — O99333 Smoking (tobacco) complicating pregnancy, third trimester: Secondary | ICD-10-CM | POA: Insufficient documentation

## 2024-03-28 DIAGNOSIS — M545 Low back pain, unspecified: Secondary | ICD-10-CM | POA: Insufficient documentation

## 2024-03-28 DIAGNOSIS — O4700 False labor before 37 completed weeks of gestation, unspecified trimester: Principal | ICD-10-CM | POA: Diagnosis present

## 2024-03-28 LAB — URINALYSIS, COMPLETE (UACMP) WITH MICROSCOPIC
Bilirubin Urine: NEGATIVE
Glucose, UA: NEGATIVE mg/dL
Hgb urine dipstick: NEGATIVE
Ketones, ur: 20 mg/dL — AB
Nitrite: NEGATIVE
Protein, ur: NEGATIVE mg/dL
Specific Gravity, Urine: 1.015 (ref 1.005–1.030)
pH: 6 (ref 5.0–8.0)

## 2024-03-28 LAB — WET PREP, GENITAL
Sperm: NONE SEEN
Trich, Wet Prep: NONE SEEN
WBC, Wet Prep HPF POC: <10 (ref <10)
Yeast Wet Prep HPF POC: NONE SEEN

## 2024-03-28 LAB — CHLAMYDIA/NGC RT PCR (ARMC ONLY)
Chlamydia Tr: NOT DETECTED
N gonorrhoeae: NOT DETECTED

## 2024-03-28 LAB — TYPE AND SCREEN
ABO/RH(D): O POS
Antibody Screen: NEGATIVE

## 2024-03-28 LAB — FETAL FIBRONECTIN: Fetal Fibronectin: NEGATIVE

## 2024-03-28 MED ORDER — TERBUTALINE SULFATE 1 MG/ML IJ SOLN: 0.2500 mg | Freq: Once | INTRAMUSCULAR | Status: DC | PRN

## 2024-03-28 MED ORDER — DOCUSATE SODIUM 100 MG PO CAPS: 100.0000 mg | ORAL_CAPSULE | Freq: Every day | ORAL | Status: DC

## 2024-03-28 MED ORDER — METRONIDAZOLE 500 MG PO TABS
ORAL_TABLET | ORAL | Status: DC
Start: 2024-03-28 — End: 2024-03-28
  Administered 2024-03-28: 500 mg via ORAL
  Filled 2024-03-28: qty 1

## 2024-03-28 MED ORDER — METRONIDAZOLE 500 MG PO TABS: 500.0000 mg | ORAL_TABLET | Freq: Two times a day (BID) | ORAL | Status: DC

## 2024-03-28 MED ORDER — METRONIDAZOLE 500 MG PO TABS: 500.0000 mg | ORAL_TABLET | Freq: Two times a day (BID) | ORAL | 0 refills | Status: AC

## 2024-03-28 MED ORDER — ACETAMINOPHEN 500 MG PO TABS: 1000.0000 mg | ORAL_TABLET | Freq: Four times a day (QID) | ORAL | Status: DC | PRN

## 2024-03-28 MED ORDER — CALCIUM CARBONATE ANTACID 500 MG PO CHEW: 2.0000 | CHEWABLE_TABLET | ORAL | Status: DC | PRN

## 2024-03-28 MED ORDER — LACTATED RINGERS IV BOLUS
1000.0000 mL | Freq: Once | INTRAVENOUS | Status: AC
  Administered 2024-03-28: 1000 mL via INTRAVENOUS

## 2024-03-28 MED ORDER — ZOLPIDEM TARTRATE 5 MG PO TABS: 5.0000 mg | ORAL_TABLET | Freq: Every evening | ORAL | Status: DC | PRN

## 2024-03-28 NOTE — OB Triage Note (Signed)
 Patient here for back pain that she describes as back labor. She has been having this back pain for two weeks and told the OB at her last visit. She was told to use tylenol  and heating pads which has not helped at all. Reports positive fetal movement. No bleeding no LOF. Monitors applied.
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
## 2024-04-26 LAB — OB RESULTS CONSOLE GBS: GBS: NEGATIVE

## 2024-04-26 LAB — OB RESULTS CONSOLE GC/CHLAMYDIA
Chlamydia: NEGATIVE
Neisseria Gonorrhea: NEGATIVE

## 2024-05-10 ENCOUNTER — Inpatient Hospital Stay
Admission: EM | Admit: 2024-05-10 | Discharge: 2024-05-12 | DRG: 807 | Disposition: A | Attending: Obstetrics | Admitting: Obstetrics and Gynecology

## 2024-05-11 ENCOUNTER — Other Ambulatory Visit: Payer: Self-pay

## 2024-05-11 ENCOUNTER — Inpatient Hospital Stay: Admitting: Anesthesiology

## 2024-05-11 ENCOUNTER — Encounter: Payer: Self-pay | Admitting: Obstetrics and Gynecology

## 2024-05-11 DIAGNOSIS — O26893 Other specified pregnancy related conditions, third trimester: Secondary | ICD-10-CM | POA: Diagnosis present

## 2024-05-11 DIAGNOSIS — Z3A38 38 weeks gestation of pregnancy: Secondary | ICD-10-CM | POA: Diagnosis not present

## 2024-05-11 DIAGNOSIS — F17210 Nicotine dependence, cigarettes, uncomplicated: Secondary | ICD-10-CM | POA: Diagnosis present

## 2024-05-11 DIAGNOSIS — O9902 Anemia complicating childbirth: Secondary | ICD-10-CM | POA: Diagnosis present

## 2024-05-11 DIAGNOSIS — O99334 Smoking (tobacco) complicating childbirth: Secondary | ICD-10-CM | POA: Diagnosis present

## 2024-05-11 LAB — TYPE AND SCREEN
ABO/RH(D): O POS
Antibody Screen: NEGATIVE

## 2024-05-11 LAB — CBC
HCT: 35 % — ABNORMAL LOW (ref 36.0–46.0)
Hemoglobin: 11.4 g/dL — ABNORMAL LOW (ref 12.0–15.0)
MCH: 28.8 pg (ref 26.0–34.0)
MCHC: 32.6 g/dL (ref 30.0–36.0)
MCV: 88.4 fL (ref 80.0–100.0)
Platelets: 178 K/uL (ref 150–400)
RBC: 3.96 MIL/uL (ref 3.87–5.11)
RDW: 15.7 % — ABNORMAL HIGH (ref 11.5–15.5)
WBC: 11.1 K/uL — ABNORMAL HIGH (ref 4.0–10.5)
nRBC: 0 % (ref 0.0–0.2)

## 2024-05-11 LAB — URINE DRUG SCREEN
Amphetamines: NEGATIVE
Barbiturates: NEGATIVE
Benzodiazepines: NEGATIVE
Cocaine: POSITIVE — AB
Fentanyl: NEGATIVE
Methadone Scn, Ur: NEGATIVE
Opiates: NEGATIVE
Tetrahydrocannabinol: POSITIVE — AB

## 2024-05-11 LAB — SYPHILIS: RPR W/REFLEX TO RPR TITER AND TREPONEMAL ANTIBODIES, TRADITIONAL SCREENING AND DIAGNOSIS ALGORITHM: RPR Ser Ql: NONREACTIVE

## 2024-05-11 MED ORDER — BISACODYL 10 MG RE SUPP: 10.0000 mg | Freq: Every day | RECTAL | Status: DC | PRN

## 2024-05-11 MED ORDER — DIBUCAINE (PERIANAL) 1 % EX OINT: 1.0000 | TOPICAL_OINTMENT | CUTANEOUS | Status: DC | PRN

## 2024-05-11 MED ORDER — BENZOCAINE-MENTHOL 20-0.5 % EX AERO: 1.0000 | INHALATION_SPRAY | CUTANEOUS | Status: DC | PRN

## 2024-05-11 MED ORDER — SODIUM CHLORIDE 0.9% FLUSH
3.0000 mL | INTRAVENOUS | Status: DC | PRN
  Administered 2024-05-11: 3 mL via INTRAVENOUS

## 2024-05-11 MED ORDER — FLEET ENEMA RE ENEM: 1.0000 | ENEMA | Freq: Every day | RECTAL | Status: DC | PRN

## 2024-05-11 MED ORDER — ACETAMINOPHEN 325 MG PO TABS: 650.0000 mg | ORAL_TABLET | ORAL | Status: DC | PRN

## 2024-05-11 MED ORDER — PHENYLEPHRINE 80 MCG/ML (10ML) SYRINGE FOR IV PUSH (FOR BLOOD PRESSURE SUPPORT): 80.0000 ug | PREFILLED_SYRINGE | INTRAVENOUS | Status: DC | PRN

## 2024-05-11 MED ORDER — ONDANSETRON HCL 4 MG PO TABS: 4.0000 mg | ORAL_TABLET | ORAL | Status: DC | PRN

## 2024-05-11 MED ORDER — LACTATED RINGERS IV SOLN: INTRAVENOUS | Status: DC

## 2024-05-11 MED ORDER — SODIUM CHLORIDE 0.9 % IV SOLN: 250.0000 mL | INTRAVENOUS | Status: DC | PRN

## 2024-05-11 MED ORDER — FENTANYL CITRATE (PF) 100 MCG/2ML IJ SOLN: 50.0000 ug | INTRAMUSCULAR | Status: DC | PRN

## 2024-05-11 MED ORDER — OXYTOCIN BOLUS FROM INFUSION
333.0000 mL | Freq: Once | INTRAVENOUS | Status: AC
  Administered 2024-05-11: 333 mL via INTRAVENOUS

## 2024-05-11 MED ORDER — COCONUT OIL OIL: 1.0000 | TOPICAL_OIL | Status: DC | PRN

## 2024-05-11 MED ORDER — ONDANSETRON HCL 4 MG/2ML IJ SOLN: 4.0000 mg | Freq: Four times a day (QID) | INTRAMUSCULAR | Status: DC | PRN

## 2024-05-11 MED ORDER — SIMETHICONE 80 MG PO CHEW: 80.0000 mg | CHEWABLE_TABLET | ORAL | Status: DC | PRN

## 2024-05-11 MED ORDER — BUPIVACAINE HCL (PF) 0.25 % IJ SOLN
INTRAMUSCULAR | Status: DC | PRN
  Administered 2024-05-11 (×2): 4 mL via EPIDURAL

## 2024-05-11 MED ORDER — DIPHENHYDRAMINE HCL 50 MG/ML IJ SOLN: 12.5000 mg | INTRAMUSCULAR | Status: DC | PRN

## 2024-05-11 MED ORDER — LIDOCAINE HCL (PF) 1 % IJ SOLN
30.0000 mL | INTRAMUSCULAR | Status: DC | PRN
  Filled 2024-05-11: qty 30

## 2024-05-11 MED ORDER — ONDANSETRON HCL 4 MG/2ML IJ SOLN: 4.0000 mg | INTRAMUSCULAR | Status: DC | PRN

## 2024-05-11 MED ORDER — PRENATAL MULTIVITAMIN CH
1.0000 | ORAL_TABLET | Freq: Every day | ORAL | Status: DC
  Administered 2024-05-11 – 2024-05-12 (×2): 1 via ORAL
  Filled 2024-05-11 (×3): qty 1

## 2024-05-11 MED ORDER — IBUPROFEN 600 MG PO TABS
600.0000 mg | ORAL_TABLET | Freq: Four times a day (QID) | ORAL | Status: DC
  Administered 2024-05-11 – 2024-05-12 (×6): 600 mg via ORAL
  Filled 2024-05-11 (×6): qty 1

## 2024-05-11 MED ORDER — LACTATED RINGERS IV SOLN
500.0000 mL | Freq: Once | INTRAVENOUS | Status: AC
  Administered 2024-05-11: 500 mL via INTRAVENOUS

## 2024-05-11 MED ORDER — SENNOSIDES-DOCUSATE SODIUM 8.6-50 MG PO TABS
2.0000 | ORAL_TABLET | ORAL | Status: DC
  Filled 2024-05-11: qty 2

## 2024-05-11 MED ORDER — OXYTOCIN-SODIUM CHLORIDE 30-0.9 UT/500ML-% IV SOLN
2.5000 [IU]/h | INTRAVENOUS | Status: DC
  Filled 2024-05-11: qty 500

## 2024-05-11 MED ORDER — WITCH HAZEL-GLYCERIN EX PADS: 1.0000 | MEDICATED_PAD | CUTANEOUS | Status: DC | PRN

## 2024-05-11 MED ORDER — OXYCODONE HCL 5 MG PO TABS: 5.0000 mg | ORAL_TABLET | ORAL | Status: DC | PRN

## 2024-05-11 MED ORDER — EPHEDRINE 5 MG/ML INJ: 10.0000 mg | INTRAVENOUS | Status: DC | PRN

## 2024-05-11 MED ORDER — FENTANYL-BUPIVACAINE-NACL 0.5-0.125-0.9 MG/250ML-% EP SOLN
12.0000 mL/h | EPIDURAL | Status: DC | PRN
  Administered 2024-05-11: 12 mL/h via EPIDURAL
  Filled 2024-05-11: qty 250

## 2024-05-11 MED ORDER — LACTATED RINGERS IV SOLN: 500.0000 mL | INTRAVENOUS | Status: DC | PRN

## 2024-05-11 MED ORDER — SOD CITRATE-CITRIC ACID 500-334 MG/5ML PO SOLN: 30.0000 mL | ORAL | Status: DC | PRN

## 2024-05-11 MED ORDER — LIDOCAINE HCL (PF) 1 % IJ SOLN
INTRAMUSCULAR | Status: DC | PRN
  Administered 2024-05-11: 3 mL via SUBCUTANEOUS

## 2024-05-11 MED ORDER — LIDOCAINE-EPINEPHRINE (PF) 1.5 %-1:200000 IJ SOLN
INTRAMUSCULAR | Status: DC | PRN
  Administered 2024-05-11: 3 mL via PERINEURAL

## 2024-05-11 MED ORDER — ACETAMINOPHEN 325 MG PO TABS
650.0000 mg | ORAL_TABLET | ORAL | Status: DC | PRN
  Administered 2024-05-11 (×2): 650 mg via ORAL
  Filled 2024-05-11 (×2): qty 2

## 2024-05-11 MED ORDER — SODIUM CHLORIDE 0.9% FLUSH
3.0000 mL | Freq: Two times a day (BID) | INTRAVENOUS | Status: DC
  Administered 2024-05-11 (×2): 3 mL via INTRAVENOUS

## 2024-05-11 MED ORDER — DIPHENHYDRAMINE HCL 25 MG PO CAPS: 25.0000 mg | ORAL_CAPSULE | Freq: Four times a day (QID) | ORAL | Status: DC | PRN

## 2024-05-11 MED ORDER — ZOLPIDEM TARTRATE 5 MG PO TABS: 5.0000 mg | ORAL_TABLET | Freq: Every evening | ORAL | Status: DC | PRN

## 2024-05-11 NOTE — Clinical Social Work Maternal (Signed)
 CLINICAL SOCIAL WORK MATERNAL/CHILD NOTE  Patient Details  Name: Stephanie Navarro MRN: [id number] Date of Birth: [birth year]  Date:  05/11/2024  Clinical Social Worker Initiating Note:  Stephanie Navarro Date/Time: Initiated:  05/11/24/1115     Child's Name:  Stephanie Navarro   Biological Parents:  Mother, Father (FOB was sleeping during consult)   Need for Interpreter:  None   Reason for Referral:  Current Substance Use/Substance Use During Pregnancy     Address:  [address] [place] [address]    Phone number:  [phone] (home)     Additional phone number:   Household Members/Support Persons (HM/SP):   Household Member/Support Person 1, Household Member/Support Person 2, Household Member/Support Person 3   HM/SP Name Relationship DOB or Age  HM/SP -1 Stephanie Navarro FOB 47  HM/SP -2 Stephanie Navarro Son of the patient 5  HM/SP -3 Stephanie Navarro Daughter of the patient 2  HM/SP -4        HM/SP -5        HM/SP -6        HM/SP -7        HM/SP -8          Natural Supports (not living in the home):  Extended Family, Parent, Immediate Family, Friends   Herbalist:     Employment:     Type of Work: [employer]   Education:  9 to 11 years   Homebound arranged: No  Financial Resources:  [employer]   Other Resources:  Sales Executive  , [employer]   Cultural/Religious Considerations Which May Impact Care:    Strengths:  Ability to meet basic needs  , Compliance with medical plan  , Pediatrician chosen, Home prepared for child     Psychotropic Medications:         Pediatrician:    [employer]  Pediatrician List:   [employer] Other ([facility] in [place])  [facility]      Pediatrician Fax Number:    Risk Factors/Current Problems:  Substance Use     Cognitive State:  Able to Concentrate     Mood/Affect:  Relaxed  , Bright     CSW Assessment:  Chart reviewed. I  received a consult for positive toxicology screen for [name] and cocaine. I was able to speak with the patient at bedside. The FOB was present but he was asleep and did not wake up during the consult.   I introduced myself, my role, and reason for consult. The patient reports that she felt good after delievery. The patient confims that her address is [address] [place] [address] and telephone number is [phone].   The patient confirmed that the FOB is Stephanie Navarro (47) and she lives in the home with him and her 2 children. The patient reports have a supports outside of the home. The patient reports that her highest level of education is 10th grade. The patient report that she will return to work in a few weeks a [employer] in [place].   The patient reports that she has no mental health history and is not currently active in any therapy or counseling. The patient denies any past or current SI/HI/DV.   The patient reports that she receives  [facility] and WIC. The patient reports that she will not breast feed. The patient reports that the baby  will go to [facility] in  for all medical needs. The patient reports that she does not have a PCP but would like resources on her D/C summary.   The patient reports that she has a crib, bassinet, pack and play, clothes, diapers, and car seat. The patient reports that the FOB will assist during D/C.   I reviewed information on Post partum depression, Sudden infant Death Syndrome, Car seat safety, Safe sleep environment, mental health resources, post partum community resources, Prenatal anxiety disorder. The patient verbalized understanding.   I informed the patient on the law and hospital policy of mandated reporting for drug exposure to children. The patient verbalized understanding and accepting. The patient reports that she she uses cocaine a month or two ago and THC more recently because it helps her eat. The patient reports that she has  had CPS involvement in the past for Substance use but not open or recent cases.   I will make the reports with [place] DSS. ICM will follow the patient and the baby until D/C. I have added PCP resources to patient AVS.   CSW Plan/Description:  Sudden Infant Death Syndrome (SIDS) Education, Perinatal Mood and Anxiety Disorder (PMADs) Education, Hospital Drug Screen Policy Information, CSW Will Continue to Monitor Umbilical Cord Tissue Drug Screen Results and Make Report if Warranted, Child Protective Service Report  , Other Information/Referral to [facility] Stephanie Ruts, LCSW 05/11/2024, 11:45 AM

## 2024-05-11 NOTE — Progress Notes (Signed)
 Postpartum Day  1  Subjective: 28 y.o. [id number] postpartum day #1 status post normal spontaneous vaginal delivery. She is ambulating, is tolerating po, is voiding spontaneously.  Her pain is well controlled on PO pain medications. Her lochia is less than menses.  Objective: BP 111/70 (BP Location: Left Arm)   Pulse 72   Temp 98.2 F (36.8 C) (Oral)   Resp 18   Ht 5' 4 (1.626 m)   Wt 68.3 kg   LMP 08/15/2023 (Exact Date)   SpO2 100%   Breastfeeding Unknown   BMI 25.85 kg/m    Physical Exam:  General: alert, cooperative, and no distress Breasts: soft/nontender Pulm: nl effort Abdomen: soft, non-tender, active bowel sounds Uterine Fundus: firm Perineum: minimal edema, intact Lochia: appropriate DVT Evaluation: No evidence of DVT seen on physical exam.  Recent Labs    05/11/24 0017  HGB 11.4*  HCT 35.0*  WBC 11.1*  PLT 178    Assessment/Plan: 28 y.o. [id number] postpartum day # 1  1. Continue routine postpartum care  2. Infant feeding status: formula feeding -Encouraged snug fitting bra, cold application, Tylenol  PRN, and cabbage leaves for engorgement for formula feeding   3. Contraception plan: Nexplanon   4. Immunization status:   all immunizations up to date  Disposition: continue inpatient postpartum care , plan for discharge home tomorrow    LOS: 0 days   Therisa CHRISTELLA Pillow, EDDY 05/11/2024, 4:44 PM   ----- Therisa Pillow  Certified Nurse Midwife [facility]

## 2024-05-11 NOTE — Discharge Instructions (Signed)
 Some PCP options in [place] area- not a comprehensive list  [facility]- [phone] [facility]- [phone] [facility]- [phone] [facility]- [phone] [facility]- [phone] Nichole Molly- [phone]  or [facility] Physician Referral Line [phone]
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
## 2024-05-11 NOTE — TOC Progression Note (Signed)
 Transition of Care [facility]) - Progression Note    Patient Details  Name: Stephanie Navarro MRN: [id number] Date of Birth: January 23, 1996  Transition of Care [facility]) CM/SW Contact  K'La JINNY Ruts, LCSW Phone Number: 05/11/2024, 1:16 PM  Clinical Narrative:    Chart reviewed. CPS report made with Ellouise Christmas. Waiting for baby cord blood to come back and if baby is having any W/D symptoms.                      Expected Discharge Plan and Services                                               Social Drivers of Health (SDOH) Interventions SDOH Screenings   Food Insecurity: No Food Insecurity (05/11/2024)  Housing: Low Risk  (05/11/2024)  Transportation Needs: No Transportation Needs (05/11/2024)  Utilities: Not At Risk (05/11/2024)  Alcohol Screen: Low Risk  (09/29/2023)  Depression (PHQ2-9): Low Risk  (09/29/2023)  Financial Resource Strain: Medium Risk (11/11/2023)   Received from [facility]  Social Connections: Moderately Integrated (05/11/2024)  Tobacco Use: High Risk (05/11/2024)    Readmission Risk Interventions     No data to display

## 2024-05-11 NOTE — Anesthesia Preprocedure Evaluation (Signed)
 Anesthesia Evaluation  Patient identified by MRN, date of birth, ID band Patient awake    Reviewed: Allergy & Precautions, NPO status , Patient's Chart, lab work & pertinent test results  History of Anesthesia Complications Negative for: history of anesthetic complications  Airway Mallampati: III  TM Distance: >3 FB Neck ROM: full    Dental  (+) Chipped   Pulmonary Current Smoker and Patient abstained from smoking.   Pulmonary exam normal        Cardiovascular Exercise Tolerance: Good (-) hypertensionnegative cardio ROS Normal cardiovascular exam     Neuro/Psych    GI/Hepatic negative GI ROS,,,  Endo/Other    Renal/GU   negative genitourinary   Musculoskeletal   Abdominal   Peds  Hematology negative hematology ROS (+)   Anesthesia Other Findings Past Medical History: 05/21/2017: Acute appendicitis with localized peritonitis No date: Anemia 01/14/2019: Chlamydia infection affecting pregnancy in third  trimester, antepartum 06/16/2018: Encounter for procreative genetic counseling     Comment:  Formatting of this note might be different from the               original. Genetic counseling visit on 06/24/2018                Aneuploidy screening/ testing:  Genetic counseling visit               pending  Carrier screening:  Genetic counseling visit               pending   WGS study eligible: no  GENETIC COUNSELING               PREVISIT SUMMARY Referring provider: [facility]                Indication: FTS Other relevant history:  Insurance:               [id number] P - (Medicaid)  M 01/04/2020: IUP (intrauterine pregnancy), incidental  Past Surgical History: 05/21/2017: APPENDECTOMY     Comment:  Dr. Wonda  05/21/2017: LAPAROSCOPIC APPENDECTOMY; N/A     Comment:  Procedure: APPENDECTOMY LAPAROSCOPIC;  Surgeon: Wonda Charlie BRAVO, MD;  Location: [facility] ORS;  Service: General;                 Laterality: N/A;  BMI    Body Mass Index: 25.85 kg/m      Reproductive/Obstetrics (+) Pregnancy                              Anesthesia Physical Anesthesia Plan  ASA: 2  Anesthesia Plan: Epidural   Post-op Pain Management:    Induction:   PONV Risk Score and Plan:   Airway Management Planned: Natural Airway  Additional Equipment:   Intra-op Plan:   Post-operative Plan:   Informed Consent: I have reviewed the patients History and Physical, chart, labs and discussed the procedure including the risks, benefits and alternatives for the proposed anesthesia with the patient or authorized representative who has indicated his/her understanding and acceptance.     Dental Advisory Given  Plan Discussed with: Anesthesiologist  Anesthesia Plan Comments: (Patient reports no bleeding problems and no anticoagulant use.   Patient consented for risks of anesthesia including but not limited to:  - adverse reactions to medications - risk of bleeding, infection and or nerve damage from epidural that could lead to  paralysis - risk of headache or failed epidural - nerve damage due to positioning - that if epidural is used for C-section that there is a chance of epidural failure requiring spinal placement or conversion to GA - Damage to heart, brain, lungs, other parts of body or loss of life  Patient voiced understanding and assent.)        Anesthesia Quick Evaluation

## 2024-05-11 NOTE — Discharge Summary (Signed)
 Postpartum Discharge Summary  Patient Name: Stephanie Navarro DOB: Oct 11, 1995 MRN: [id number]  Date of admission: 05/10/2024 Delivery date:05/11/2024 Delivering provider: DICKERSON, FELICIA Date of discharge: 05/12/2024  Primary OB: [facility] OFE:Ejupzwu'd last menstrual period was 08/15/2023 (exact date). EDC Estimated Date of Delivery: 05/21/24 Gestational Age at Delivery: [redacted]w[redacted]d   Admitting diagnosis: Normal labor and delivery [O80] Intrauterine pregnancy: [redacted]w[redacted]d     Secondary diagnosis:   Principal Problem:   NSVD (normal spontaneous vaginal delivery) Active Problems:   Normal labor and delivery   Discharge Diagnosis: Term Pregnancy Delivered and Anemia      Hospital course: Onset of Labor With Vaginal Delivery      28 y.o. yo [id number] at [redacted]w[redacted]d was admitted in Active Labor on 05/10/2024. Labor course was complicated by moderate meconium amniotic fluid.  Membrane Rupture Time/Date: 1:10 AM,05/11/2024  Delivery Method:Vaginal, Spontaneous Operative Delivery:N/A Episiotomy: None Lacerations:  None Patient had a postpartum course complicated by none.  She is ambulating, tolerating a regular diet, passing flatus, and urinating well. Patient is discharged home in stable condition on 05/12/24.  Newborn Data:[name] Birth date:05/11/2024 Birth time:1:22 AM Gender:Female Living status:Living Apgars:9 ,9  Weight:3210 g                                            Post partum procedures: none Complications: None Delivery Type: spontaneous vaginal delivery Anesthesia: epidural anesthesia Placenta: spontaneous To Pathology: No   Prenatal Labs:   Blood type/Rh O positive  Antibody screen Negative    Rubella Immune (06/03 0000)   Varicella Immune  RPR NR    HBsAg Negative (06/03 0000)  Hep C NR   HIV Non-reactive (09/23 0000)   GC neg  Chlamydia neg  Genetic screening cfDNA negative   1 hour GTT 85  3 hour GTT N/a  GBS Negative/-- (11/17 0000)      Magnesium   Sulfate received: No BMZ received: No Rhophylac:was not indicated MMR: was not indicated Varivax vaccine given: was not indicated Tdap vaccine: declined Flu vaccine: declined RSV vaccine:declined  Transfusion:No  Physical exam  Vitals:   05/11/24 1521 05/11/24 1954 05/11/24 2310 05/12/24 0832  BP: 111/70 101/68 112/66 103/63  Pulse: 72 75 65 (!) 57  Resp: 18 17 18 20   Temp: 98.2 F (36.8 C) 98.5 F (36.9 C) 98.3 F (36.8 C) 98.1 F (36.7 C)  TempSrc: Oral Oral Oral Oral  SpO2: 100% 98% 98% 98%  Weight:      Height:       General: alert, cooperative, and no distress Lochia: appropriate Uterine Fundus: firm Perineum:minimal edema/intact DVT Evaluation: No evidence of DVT seen on physical exam.  Labs: Lab Results  Component Value Date   WBC 11.4 (H) 05/12/2024   HGB 9.5 (L) 05/12/2024   HCT 29.1 (L) 05/12/2024   MCV 89.0 05/12/2024   PLT 162 05/12/2024      Latest Ref Rng & Units 04/15/2023    4:18 PM  CMP  Glucose 70 - 99 mg/dL 883   BUN 6 - 20 mg/dL 16   Creatinine 9.55 - 1.00 mg/dL 9.20   Sodium 864 - 854 mmol/L 133   Potassium 3.5 - 5.1 mmol/L 2.8   Chloride 98 - 111 mmol/L 101   CO2 22 - 32 mmol/L 21   Calcium  8.9 - 10.3 mg/dL 8.8   Total Protein 6.5 - 8.1 g/dL 8.6  Total Bilirubin <1.2 mg/dL 0.9   Alkaline Phos 38 - 126 U/L 58   AST 15 - 41 U/L 19   ALT 0 - 44 U/L 11    Edinburgh Score:    05/11/2024    7:50 PM  Edinburgh Postnatal Depression Scale Screening Tool  I have been able to laugh and see the funny side of things. 0  I have looked forward with enjoyment to things. 0  I have blamed myself unnecessarily when things went wrong. 0  I have been anxious or worried for no good reason. 0  I have felt scared or panicky for no good reason. 1  Things have been getting on top of me. 2  I have been so unhappy that I have had difficulty sleeping. 2  I have felt sad or miserable. 1  I have been so unhappy that I have been crying. 1  The thought of  harming myself has occurred to me. 0  Edinburgh Postnatal Depression Scale Total 7     Postpartum VTE Prophylaxis  Recommend 6 weeks of prophylactic anticoagulation with LMWH or subcutaneous unfractionated heparin if 1 or more high risk factor is present.  Recommend 14 days of prophylactic anticoagulation with LMWH or subcutaneous unfractionated heparin if 3 or more moderate risk factors are present.   Risk assessment for postpartum VTE and prophylactic treatment: High risk factors: None Moderate risk factors: None  Postpartum VTE prophylaxis with LMWH not indicated    After visit meds:  Allergies as of 05/12/2024   No Known Allergies      Medication List     TAKE these medications    acetaminophen  325 MG tablet Commonly known as: Tylenol  Take 2 tablets (650 mg total) by mouth every 6 (six) hours as needed (for pain scale < 4). What changed:  medication strength how much to take reasons to take this   ferrous sulfate  325 (65 FE) MG EC tablet Take 325 mg by mouth 2 (two) times daily.   ibuprofen  600 MG tablet Commonly known as: ADVIL  Take 1 tablet (600 mg total) by mouth every 6 (six) hours as needed.   prenatal vitamin w/FE, FA 27-1 MG Tabs tablet Take 1 tablet by mouth daily.       Discharge home in stable condition Infant Feeding: Bottle and Breast Infant Disposition:home with mother Discharge instruction: per After Visit Summary and Postpartum booklet. Activity: Advance as tolerated. Pelvic rest for 6 weeks.  Diet: routine diet Anticipated Birth Control:  Contraceptives: Nexplanon  Postpartum Appointment:6 weeks Additional Postpartum F/U: Postpartum Depression checkup Future Appointments:No future appointments. Follow up Visit:  Follow-up Information     [facility] Follow up in 2 week(s).   Why: mood check Contact information: [address] [place]  [address] [phone]        Aisha Heller, CNM Follow up  in 6 week(s).   Specialty: Obstetrics Why: pp visit Contact information: [address] [place] [address] [phone]                 Plan:  Stephanie Navarro was discharged to home in good condition. Follow-up appointment as directed.    SignedBETHA Margery FORBES Myron 05/12/2024 12:54 PM

## 2024-05-11 NOTE — Anesthesia Procedure Notes (Signed)
 Epidural Patient location during procedure: OB Start time: 05/11/2024 12:48 AM End time: 05/11/2024 12:52 AM  Staffing Anesthesiologist: Reannon Candella, Fairy POUR, MD Performed: anesthesiologist   Preanesthetic Checklist Completed: patient identified, IV checked, site marked, risks and benefits discussed, surgical consent, monitors and equipment checked, pre-op evaluation and timeout performed  Epidural Patient position: sitting Prep: ChloraPrep Patient monitoring: heart rate, continuous pulse ox and blood pressure Approach: midline Location: L3-L4 Injection technique: LOR saline  Needle:  Needle type: Tuohy  Needle gauge: 17 G Needle length: 9 cm and 9 Needle insertion depth: 5 cm Catheter type: closed end flexible Catheter size: 19 Gauge Catheter at skin depth: 10 cm Test dose: negative and 1.5% lidocaine  with Epi 1:200 K  Assessment Sensory level: T10 Events: blood not aspirated, no cerebrospinal fluid, injection not painful, no injection resistance, no paresthesia and negative IV test  Additional Notes 1 attempt Pt. Evaluated and documentation done after procedure finished. Patient identified. Risks/Benefits/Options discussed with patient including but not limited to bleeding, infection, nerve damage, paralysis, failed block, incomplete pain control, headache, blood pressure changes, nausea, vomiting, reactions to medication both or allergic, itching and postpartum back pain. Confirmed with bedside nurse the patient's most recent platelet count. Confirmed with patient that they are not currently taking any anticoagulation, have any bleeding history or any family history of bleeding disorders. Patient expressed understanding and wished to proceed. All questions were answered. Sterile technique was used throughout the entire procedure. Please see nursing notes for vital signs. Test dose was given through epidural catheter and negative prior to continuing to dose epidural or start  infusion. Warning signs of high block given to the patient including shortness of breath, tingling/numbness in hands, complete motor block, or any concerning symptoms with instructions to call for help. Patient was given instructions on fall risk and not to get out of bed. All questions and concerns addressed with instructions to call with any issues or inadequate analgesia.    Patient tolerated the insertion well without immediate complications.Reason for block:procedure for pain

## 2024-05-12 LAB — CBC
HCT: 29.1 % — ABNORMAL LOW (ref 36.0–46.0)
Hemoglobin: 9.5 g/dL — ABNORMAL LOW (ref 12.0–15.0)
MCH: 29.1 pg (ref 26.0–34.0)
MCHC: 32.6 g/dL (ref 30.0–36.0)
MCV: 89 fL (ref 80.0–100.0)
Platelets: 162 K/uL (ref 150–400)
RBC: 3.27 MIL/uL — ABNORMAL LOW (ref 3.87–5.11)
RDW: 16 % — ABNORMAL HIGH (ref 11.5–15.5)
WBC: 11.4 K/uL — ABNORMAL HIGH (ref 4.0–10.5)
nRBC: 0 % (ref 0.0–0.2)

## 2024-05-12 MED ORDER — ACETAMINOPHEN 325 MG PO TABS: 650.0000 mg | ORAL_TABLET | Freq: Four times a day (QID) | ORAL | Status: AC | PRN

## 2024-05-12 MED ORDER — IBUPROFEN 600 MG PO TABS: 600.0000 mg | ORAL_TABLET | Freq: Four times a day (QID) | ORAL | Status: AC | PRN

## 2024-05-12 NOTE — Anesthesia Postprocedure Evaluation (Signed)
 Anesthesia Post Note  Patient: Stephanie Navarro  Procedure(s) Performed: AN AD HOC LABOR EPIDURAL  Patient location during evaluation: Mother Baby Anesthesia Type: Epidural Level of consciousness: awake and alert Pain management: pain level controlled Vital Signs Assessment: post-procedure vital signs reviewed and stable Respiratory status: spontaneous breathing, nonlabored ventilation and respiratory function stable Cardiovascular status: stable Postop Assessment: no headache, no backache and epidural receding Anesthetic complications: no   No notable events documented.   Last Vitals:  Vitals:   05/11/24 2310 05/12/24 0832  BP: 112/66 103/63  Pulse: 65 (!) 57  Resp: 18 20  Temp: 36.8 C 36.7 C  SpO2: 98% 98%    Last Pain:  Vitals:   05/12/24 0832  TempSrc: Oral  PainSc:                  Madalyn Hammock

## 2024-05-12 NOTE — Progress Notes (Signed)
 Patient discharged home with family. Discharge instructions, when to follow up, and prescriptions reviewed with patient. Patient verbalized understanding. Patient will be escorted out by auxiliary.

## 2024-05-12 NOTE — Progress Notes (Deleted)
 Postpartum Day  1  Subjective: 28 y.o. H4E5985 postpartum day #1 status post normal spontaneous vaginal delivery. She is ambulating, is tolerating po, is voiding spontaneously.  Her pain is well controlled on PO pain medications. Her lochia is less than menses.  Objective: BP 103/63 (BP Location: Left Arm)   Pulse (!) 57   Temp 98.1 F (36.7 C) (Oral)   Resp 20   Ht 5' 4 (1.626 m)   Wt 68.3 kg   LMP 08/15/2023 (Exact Date)   SpO2 98%   Breastfeeding Unknown   BMI 25.85 kg/m    Physical Exam:  General: alert, cooperative, and no distress Breasts: soft/nontender Pulm: nl effort Abdomen: soft, non-tender Uterine Fundus: firm Perineum: minimal edema, intact Lochia: appropriate DVT Evaluation: No evidence of DVT seen on physical exam.  Recent Labs    05/11/24 0017 05/12/24 0540  HGB 11.4* 9.5*  HCT 35.0* 29.1*  WBC 11.1* 11.4*  PLT 178 162    Assessment/Plan: 28 y.o. H4E5985 postpartumm day # 1  1. Continue routine postpartum care  2. Infant feeding status: formula feeding  3. Contraception plan: Nexplanon   4. Immunization status:   all immunizations up to date  Disposition: continue inpatient postpartum care , plan for discharge home tomorrow    LOS: 1 day   Margery FORBES Coe, CNM 05/12/2024, 12:45 PM

## 2024-05-12 NOTE — TOC Progression Note (Signed)
 Transition of Care [facility]) - Progression Note    Patient Details  Name: Stephanie Navarro MRN: [id number] Date of Birth: 1995/11/11  [facility]) CM/SW Contact  K'La JINNY Ruts, LCSW Phone Number: 05/12/2024, 3:41 PM  Clinical Narrative:    Chart reviewed. Spoke with Ms. Consepcion Sheen and she confirmed that is was ok for the baby too D/C. Ms. Sheen reports that she will follow the baby for atleast 45 days when he is out of the hospital.                      Expected Discharge Plan and Services         Expected Discharge Date: 05/12/24                                     Social Drivers of Health (SDOH) Interventions SDOH Screenings   Food Insecurity: No Food Insecurity (05/11/2024)  Housing: Low Risk  (05/11/2024)  Transportation Needs: No Transportation Needs (05/11/2024)  Utilities: Not At Risk (05/11/2024)  Alcohol Screen: Low Risk  (09/29/2023)  Depression (PHQ2-9): Low Risk  (09/29/2023)  Financial Resource Strain: Medium Risk (11/11/2023)   Received from [facility]  Social Connections: Moderately Integrated (05/11/2024)  Tobacco Use: High Risk (05/11/2024)    Readmission Risk Interventions     No data to display

## 2024-05-12 NOTE — TOC Progression Note (Signed)
 Transition of Care [facility]) - Progression Note    Patient Details  Name: Stephanie Navarro MRN: [id number] Date of Birth: [birth year]  Transition of Care [facility]) CM/SW Contact  K'La JINNY Ruts, LCSW Phone Number: 05/12/2024, 12:37 PM  Clinical Narrative:    Chart reviewed. Spoke with [name]. They have screened in the report. The patient was assigned a case worker Consepcion Sheen [phone]. I spoke with the worker and she reports that she will come to [facility] to complete safety assessment with the patient.                      Expected Discharge Plan and Services                                               Social Drivers of Health (SDOH) Interventions SDOH Screenings   Food Insecurity: No Food Insecurity (05/11/2024)  Housing: Low Risk  (05/11/2024)  Transportation Needs: No Transportation Needs (05/11/2024)  Utilities: Not At Risk (05/11/2024)  Alcohol Screen: Low Risk  (09/29/2023)  Depression (PHQ2-9): Low Risk  (09/29/2023)  Financial Resource Strain: Medium Risk (11/11/2023)   Received from [facility]  Social Connections: Moderately Integrated (05/11/2024)  Tobacco Use: High Risk (05/11/2024)    Readmission Risk Interventions     No data to display
